# Patient Record
Sex: Female | Born: 1955
Health system: Southern US, Community
[De-identification: ages and names within clinical notes are randomized; demographics above are authoritative.]

## PROBLEM LIST (undated history)

## (undated) DIAGNOSIS — E119 Type 2 diabetes mellitus without complications: Secondary | ICD-10-CM

## (undated) DIAGNOSIS — D649 Anemia, unspecified: Secondary | ICD-10-CM

## (undated) DIAGNOSIS — I10 Essential (primary) hypertension: Secondary | ICD-10-CM

## (undated) DIAGNOSIS — E785 Hyperlipidemia, unspecified: Secondary | ICD-10-CM

## (undated) HISTORY — DX: Type 2 diabetes mellitus without complications: E11.9

## (undated) HISTORY — DX: Hyperlipidemia, unspecified: E78.5

## (undated) HISTORY — DX: Essential (primary) hypertension: I10

## (undated) HISTORY — DX: Anemia, unspecified: D64.9

## (undated) HISTORY — PX: PARTIAL HYSTERECTOMY: SHX80

---

## 1998-07-26 ENCOUNTER — Inpatient Hospital Stay (HOSPITAL_COMMUNITY): Admission: RE | Admit: 1998-07-26 | Discharge: 1998-07-28 | Payer: Self-pay | Admitting: Gynecology

## 2002-02-25 ENCOUNTER — Other Ambulatory Visit: Admission: RE | Admit: 2002-02-25 | Discharge: 2002-02-25 | Payer: Self-pay | Admitting: Gynecology

## 2004-11-21 ENCOUNTER — Other Ambulatory Visit: Admission: RE | Admit: 2004-11-21 | Discharge: 2004-11-21 | Payer: Self-pay | Admitting: Gynecology

## 2014-01-28 DIAGNOSIS — I10 Essential (primary) hypertension: Secondary | ICD-10-CM | POA: Insufficient documentation

## 2014-01-28 DIAGNOSIS — E1159 Type 2 diabetes mellitus with other circulatory complications: Secondary | ICD-10-CM | POA: Insufficient documentation

## 2015-06-03 ENCOUNTER — Ambulatory Visit: Payer: Self-pay | Admitting: Family Medicine

## 2016-03-24 DIAGNOSIS — Z6831 Body mass index (BMI) 31.0-31.9, adult: Secondary | ICD-10-CM | POA: Diagnosis not present

## 2016-03-24 DIAGNOSIS — Z01419 Encounter for gynecological examination (general) (routine) without abnormal findings: Secondary | ICD-10-CM | POA: Diagnosis not present

## 2016-03-24 DIAGNOSIS — Z1231 Encounter for screening mammogram for malignant neoplasm of breast: Secondary | ICD-10-CM | POA: Diagnosis not present

## 2016-04-10 DIAGNOSIS — Z1389 Encounter for screening for other disorder: Secondary | ICD-10-CM | POA: Diagnosis not present

## 2016-04-10 DIAGNOSIS — Z131 Encounter for screening for diabetes mellitus: Secondary | ICD-10-CM | POA: Diagnosis not present

## 2016-04-10 DIAGNOSIS — Z13220 Encounter for screening for lipoid disorders: Secondary | ICD-10-CM | POA: Diagnosis not present

## 2016-04-10 DIAGNOSIS — Z6830 Body mass index (BMI) 30.0-30.9, adult: Secondary | ICD-10-CM | POA: Diagnosis not present

## 2016-04-10 DIAGNOSIS — Z1329 Encounter for screening for other suspected endocrine disorder: Secondary | ICD-10-CM | POA: Diagnosis not present

## 2016-04-10 DIAGNOSIS — Z01419 Encounter for gynecological examination (general) (routine) without abnormal findings: Secondary | ICD-10-CM | POA: Diagnosis not present

## 2016-04-14 ENCOUNTER — Encounter: Payer: Self-pay | Admitting: Obstetrics & Gynecology

## 2016-05-26 DIAGNOSIS — Z6830 Body mass index (BMI) 30.0-30.9, adult: Secondary | ICD-10-CM | POA: Diagnosis not present

## 2016-05-26 DIAGNOSIS — I10 Essential (primary) hypertension: Secondary | ICD-10-CM | POA: Diagnosis not present

## 2016-05-26 DIAGNOSIS — E1165 Type 2 diabetes mellitus with hyperglycemia: Secondary | ICD-10-CM | POA: Diagnosis not present

## 2016-05-26 DIAGNOSIS — R718 Other abnormality of red blood cells: Secondary | ICD-10-CM | POA: Diagnosis not present

## 2017-08-01 ENCOUNTER — Encounter: Payer: Self-pay | Admitting: Gastroenterology

## 2017-09-12 ENCOUNTER — Telehealth: Payer: Self-pay | Admitting: *Deleted

## 2017-09-12 NOTE — Telephone Encounter (Signed)
No show PV today, called pt. Line busy will try again later.

## 2017-09-12 NOTE — Telephone Encounter (Signed)
Pt called back and reschedule PV 11/07/17.

## 2017-09-25 ENCOUNTER — Encounter: Payer: Self-pay | Admitting: Gastroenterology

## 2017-11-21 ENCOUNTER — Encounter: Payer: Self-pay | Admitting: Gastroenterology

## 2018-01-07 ENCOUNTER — Ambulatory Visit (AMBULATORY_SURGERY_CENTER): Payer: Self-pay | Admitting: *Deleted

## 2018-01-07 VITALS — Ht 60.0 in | Wt 162.0 lb

## 2018-01-07 DIAGNOSIS — Z1211 Encounter for screening for malignant neoplasm of colon: Secondary | ICD-10-CM

## 2018-01-07 MED ORDER — PEG-KCL-NACL-NASULF-NA ASC-C 140 G PO SOLR: 1.0000 | Freq: Once | ORAL | 0 refills | Status: AC

## 2018-01-07 NOTE — Progress Notes (Signed)
Patient denies any allergies to eggs or soy. Patient denies any problems with anesthesia/sedation. Patient denies any oxygen use at home. Patient denies taking any diet/weight loss medications or blood thinners. EMMI education assisgned to patient on colonoscopy, this was explained and instructions given to patient. [employer] universal co-pay card given to pt. And explained.

## 2018-01-21 ENCOUNTER — Encounter: Payer: Self-pay | Admitting: Gastroenterology

## 2018-01-22 DIAGNOSIS — Z1231 Encounter for screening mammogram for malignant neoplasm of breast: Secondary | ICD-10-CM | POA: Diagnosis not present

## 2018-01-25 DIAGNOSIS — Z6832 Body mass index (BMI) 32.0-32.9, adult: Secondary | ICD-10-CM | POA: Diagnosis not present

## 2018-01-25 DIAGNOSIS — R638 Other symptoms and signs concerning food and fluid intake: Secondary | ICD-10-CM | POA: Insufficient documentation

## 2018-01-25 DIAGNOSIS — Z01411 Encounter for gynecological examination (general) (routine) with abnormal findings: Secondary | ICD-10-CM | POA: Diagnosis not present

## 2018-01-25 DIAGNOSIS — E669 Obesity, unspecified: Secondary | ICD-10-CM | POA: Insufficient documentation

## 2018-01-29 DIAGNOSIS — D573 Sickle-cell trait: Secondary | ICD-10-CM | POA: Diagnosis not present

## 2018-01-29 DIAGNOSIS — E7800 Pure hypercholesterolemia, unspecified: Secondary | ICD-10-CM | POA: Diagnosis not present

## 2018-01-29 DIAGNOSIS — I10 Essential (primary) hypertension: Secondary | ICD-10-CM | POA: Diagnosis not present

## 2018-01-29 DIAGNOSIS — E119 Type 2 diabetes mellitus without complications: Secondary | ICD-10-CM | POA: Diagnosis not present

## 2018-04-01 ENCOUNTER — Ambulatory Visit (AMBULATORY_SURGERY_CENTER): Payer: Self-pay | Admitting: *Deleted

## 2018-04-01 ENCOUNTER — Encounter: Payer: Self-pay | Admitting: Gastroenterology

## 2018-04-01 VITALS — Ht 60.0 in | Wt 154.0 lb

## 2018-04-01 DIAGNOSIS — Z1211 Encounter for screening for malignant neoplasm of colon: Secondary | ICD-10-CM

## 2018-04-01 MED ORDER — PEG-KCL-NACL-NASULF-NA ASC-C 140 G PO SOLR: 1.0000 | Freq: Once | ORAL | 0 refills | Status: AC

## 2018-04-01 NOTE — Progress Notes (Signed)
Patient denies any allergies to eggs or soy. Patient denies any problems with anesthesia/sedation. Patient denies any oxygen use at home. Patient denies taking any diet/weight loss medications or blood thinners. EMMI education assisgned to patient on colonoscopy, this was explained and instructions given to patient. 

## 2018-04-11 ENCOUNTER — Encounter: Payer: Self-pay | Admitting: *Deleted

## 2018-04-15 ENCOUNTER — Encounter: Payer: Self-pay | Admitting: Gastroenterology

## 2018-04-15 ENCOUNTER — Ambulatory Visit (AMBULATORY_SURGERY_CENTER): Payer: BLUE CROSS/BLUE SHIELD | Admitting: Gastroenterology

## 2018-04-15 VITALS — BP 118/59 | HR 72 | Temp 97.5°F | Resp 18 | Ht 60.0 in | Wt 154.0 lb

## 2018-04-15 DIAGNOSIS — Z1211 Encounter for screening for malignant neoplasm of colon: Secondary | ICD-10-CM | POA: Diagnosis not present

## 2018-04-15 MED ORDER — SODIUM CHLORIDE 0.9 % IV SOLN: 500.0000 mL | Freq: Once | INTRAVENOUS | Status: DC

## 2018-04-15 NOTE — Progress Notes (Signed)
Pt's states no medical or surgical changes since previsit or office visit. 

## 2018-04-15 NOTE — Patient Instructions (Signed)
YOU HAD AN ENDOSCOPIC PROCEDURE TODAY AT THE [facility]:   Refer to the procedure report that was given to you for any specific questions about what was found during the examination.  If the procedure report does not answer your questions, please call your gastroenterologist to clarify.  If you requested that your care partner not be given the details of your procedure findings, then the procedure report has been included in a sealed envelope for you to review at your convenience later.  YOU SHOULD EXPECT: Some feelings of bloating in the abdomen. Passage of more gas than usual.  Walking can help get rid of the air that was put into your GI tract during the procedure and reduce the bloating. If you had a lower endoscopy (such as a colonoscopy or flexible sigmoidoscopy) you may notice spotting of blood in your stool or on the toilet paper. If you underwent a bowel prep for your procedure, you may not have a normal bowel movement for a few days.  Please Note:  You might notice some irritation and congestion in your nose or some drainage.  This is from the oxygen used during your procedure.  There is no need for concern and it should clear up in a day or so.  SYMPTOMS TO REPORT IMMEDIATELY:   Following lower endoscopy (colonoscopy or flexible sigmoidoscopy):  Excessive amounts of blood in the stool  Significant tenderness or worsening of abdominal pains  Swelling of the abdomen that is new, acute  Fever of 100F or higher  For urgent or emergent issues, a gastroenterologist can be reached at any hour by calling [phone].   DIET:  We do recommend a small meal at first, but then you may proceed to your regular diet.  Drink plenty of fluids but you should avoid alcoholic beverages for 24 hours.  ACTIVITY:  You should plan to take it easy for the rest of today and you should NOT DRIVE or use heavy machinery until tomorrow (because of the sedation medicines used during the test).     FOLLOW UP: Our staff will call the number listed on your records the next business day following your procedure to check on you and address any questions or concerns that you may have regarding the information given to you following your procedure. If we do not reach you, we will leave a message.  However, if you are feeling well and you are not experiencing any problems, there is no need to return our call.  We will assume that you have returned to your regular daily activities without incident.  If any biopsies were taken you will be contacted by phone or by letter within the next 1-3 weeks.  Please call us at [phone] if you have not heard about the biopsies in 3 weeks.    SIGNATURES/CONFIDENTIALITY: You and/or your care partner have signed paperwork which will be entered into your electronic medical record.  These signatures attest to the fact that that the information above on your After Visit Summary has been reviewed and is understood.  Full responsibility of the confidentiality of this discharge information lies with you and/or your care-partner. 

## 2018-04-15 NOTE — Op Note (Signed)
[facility] Patient Name: Marcia Garcia Procedure Date: 04/15/2018 10:14 AM MRN: [id number] Endoscopist: Sherilyn Cooter L. Myrtie Neither , MD Age: 62 Referring MD:  Date of Birth: 1956/02/08 Gender: Female Account #: [id number] Procedure:                Colonoscopy Indications:              Screening for colorectal malignant neoplasm, This                            is the patient's first colonoscopy Medicines:                Monitored Anesthesia Care Procedure:                Pre-Anesthesia Assessment:                           - Prior to the procedure, a History and Physical                            was performed, and patient medications and                            allergies were reviewed. The patient's tolerance of                            previous anesthesia was also reviewed. The risks                            and benefits of the procedure and the sedation                            options and risks were discussed with the patient.                            All questions were answered, and informed consent                            was obtained. Anticoagulants: The patient has taken                            aspirin. It was decided not to withhold this                            medication prior to the procedure. ASA Grade                            Assessment: II - A patient with mild systemic                            disease. After reviewing the risks and benefits,                            the patient was deemed in satisfactory condition to  undergo the procedure.                           After obtaining informed consent, the colonoscope                            was passed under direct vision. Throughout the                            procedure, the patient's blood pressure, pulse, and                            oxygen saturations were monitored continuously. The                            Model CF-HQ190L [id number]) scope was introduced                             through the anus and advanced to the the cecum,                            identified by appendiceal orifice and ileocecal                            valve. The colonoscopy was performed without                            difficulty. The patient tolerated the procedure                            well. The quality of the bowel preparation was                            excellent. The ileocecal valve, appendiceal                            orifice, and rectum were photographed. The quality                            of the bowel preparation was evaluated using the                            BBPS [facility] Bowel Preparation Scale) with scores                            of: Right Colon = 3, Transverse Colon = 3 and Left                            Colon = 3 (entire mucosa seen well with no residual                            staining, small fragments of stool or opaque  liquid). The total BBPS score equals 9. Scope In: 10:18:25 AM Scope Out: 10:33:15 AM Scope Withdrawal Time: 0 hours 11 minutes 6 seconds  Total Procedure Duration: 0 hours 14 minutes 50 seconds  Findings:                 The digital rectal exam findings include decreased                            sphincter tone.                           The entire examined colon appeared normal on direct                            and retroflexion views. Complications:            No immediate complications. Estimated Blood Loss:     Estimated blood loss: none. Impression:               - Decreased sphincter tone found on digital rectal                            exam.                           - The entire examined colon is normal on direct and                            retroflexion views.                           - No specimens collected. Recommendation:           - Patient has a contact number available for                            emergencies. The signs and symptoms of potential                             delayed complications were discussed with the                            patient. Return to normal activities tomorrow.                            Written discharge instructions were provided to the                            patient.                           - Resume previous diet.                           - Continue present medications.                           - Repeat colonoscopy in 10 years for screening  purposes. Sonja Manseau L. Myrtie Neither, MD 04/15/2018 10:35:44 AM This report has been signed electronically.

## 2018-04-15 NOTE — Progress Notes (Signed)
Report given to PACU, vss 

## 2018-04-16 ENCOUNTER — Telehealth: Payer: Self-pay

## 2018-04-16 NOTE — Telephone Encounter (Signed)
  Follow up Call-  Call back number 04/15/2018  Post procedure Call Back phone  # [phone]  Permission to leave phone message Yes  Some recent data might be hidden     Patient questions:  Do you have a fever, pain , or abdominal swelling? No. Pain Score  0 *  Have you tolerated food without any problems? Yes.    Have you been able to return to your normal activities? Yes.    Do you have any questions about your discharge instructions: Diet   No. Medications  No. Follow up visit  No.  Do you have questions or concerns about your Care? No.  Actions: * If pain score is 4 or above: No action needed, pain <4.

## 2018-07-26 DIAGNOSIS — E1165 Type 2 diabetes mellitus with hyperglycemia: Secondary | ICD-10-CM | POA: Diagnosis not present

## 2018-07-26 DIAGNOSIS — R82998 Other abnormal findings in urine: Secondary | ICD-10-CM | POA: Diagnosis not present

## 2018-07-26 DIAGNOSIS — Z0000 Encounter for general adult medical examination without abnormal findings: Secondary | ICD-10-CM | POA: Diagnosis not present

## 2018-07-26 DIAGNOSIS — E7800 Pure hypercholesterolemia, unspecified: Secondary | ICD-10-CM | POA: Diagnosis not present

## 2018-08-02 DIAGNOSIS — Z0000 Encounter for general adult medical examination without abnormal findings: Secondary | ICD-10-CM | POA: Diagnosis not present

## 2018-08-02 DIAGNOSIS — D573 Sickle-cell trait: Secondary | ICD-10-CM | POA: Diagnosis not present

## 2018-08-02 DIAGNOSIS — R718 Other abnormality of red blood cells: Secondary | ICD-10-CM | POA: Diagnosis not present

## 2018-08-02 DIAGNOSIS — Z1331 Encounter for screening for depression: Secondary | ICD-10-CM | POA: Diagnosis not present

## 2018-08-02 DIAGNOSIS — E119 Type 2 diabetes mellitus without complications: Secondary | ICD-10-CM | POA: Diagnosis not present

## 2018-08-02 DIAGNOSIS — I10 Essential (primary) hypertension: Secondary | ICD-10-CM | POA: Diagnosis not present

## 2018-08-05 DIAGNOSIS — H2512 Age-related nuclear cataract, left eye: Secondary | ICD-10-CM | POA: Diagnosis not present

## 2018-08-05 DIAGNOSIS — H524 Presbyopia: Secondary | ICD-10-CM | POA: Diagnosis not present

## 2018-08-05 DIAGNOSIS — Z961 Presence of intraocular lens: Secondary | ICD-10-CM | POA: Diagnosis not present

## 2019-03-11 DIAGNOSIS — E119 Type 2 diabetes mellitus without complications: Secondary | ICD-10-CM | POA: Diagnosis not present

## 2019-03-11 DIAGNOSIS — D573 Sickle-cell trait: Secondary | ICD-10-CM | POA: Diagnosis not present

## 2019-03-11 DIAGNOSIS — E7800 Pure hypercholesterolemia, unspecified: Secondary | ICD-10-CM | POA: Diagnosis not present

## 2019-07-17 ENCOUNTER — Telehealth: Payer: Self-pay

## 2019-07-17 NOTE — Telephone Encounter (Signed)
Patient calling and states that her late husband, Marcia Garcia, was a patient of yours and she was always impressed with your work. Wanted to know if you'd be willing to accept her as a new patient?

## 2019-07-18 NOTE — Telephone Encounter (Signed)
I can accept her

## 2019-07-18 NOTE — Telephone Encounter (Signed)
Patient scheduled for 07/29/2019 at 10:30am.

## 2019-07-28 DIAGNOSIS — E1169 Type 2 diabetes mellitus with other specified complication: Secondary | ICD-10-CM | POA: Insufficient documentation

## 2019-07-28 DIAGNOSIS — E119 Type 2 diabetes mellitus without complications: Secondary | ICD-10-CM | POA: Insufficient documentation

## 2019-07-28 DIAGNOSIS — E785 Hyperlipidemia, unspecified: Secondary | ICD-10-CM | POA: Insufficient documentation

## 2019-07-28 NOTE — Progress Notes (Signed)
Subjective:    Patient ID: Marcia Garcia, female    DOB: 05-23-1956, 64 y.o.   MRN: [id number]  HPI  She is here to establish with a new pcp.  I saw her late husband who died in 09-30-17.   The patient is here for follow up of their chronic medical problems.  She is not exercising regularly.      Hypertension: She is taking her medication daily. She is compliant with a low sodium diet.  She does not monitor her blood pressure at home.    Diabetes: She is taking her medication daily as prescribed. She is compliant with a diabetic diet.  She monitors her sugars and they have been running 90-100's. She denies numbness/tingling in her feet. She is not up-to-date with an ophthalmology examination.   Hyperlipidemia: She is not taking her medication daily.  She had a restless leg-like feeling with the Crestor and stopped taking it.  She is compliant with a low fat/cholesterol diet.      Medications and allergies reviewed with patient and updated if appropriate.  Patient Active Problem List   Diagnosis Date Noted  . Diabetes ([place]) 07/28/2019  . Hyperlipidemia 07/28/2019  . Increased body mass index 01/25/2018  . Hypertensive disorder 01/28/2014    Current Outpatient Medications on File Prior to Visit  Medication Sig Dispense Refill  . acetaminophen (TYLENOL) 500 MG tablet Take 1,000 mg by mouth daily.    Marland Kitchen aspirin EC 81 MG tablet Take 1 tablet by mouth daily.    Marland Kitchen lisinopril-hydrochlorothiazide (ZESTORETIC) 20-25 MG tablet Take 1 tablet by mouth daily.    . Multiple Vitamin (MULTIVITAMIN) tablet Take 1 tablet by mouth daily.    Marland Kitchen SYNJARDY XR 10-998 MG TB24 Take 1 tablet by mouth daily.    . Rosuvastatin Calcium (CRESTOR PO) Take 1 tablet by mouth daily.     No current facility-administered medications on file prior to visit.    Past Medical History:  Diagnosis Date  . Anemia   . Diabetes mellitus without complication ([place])   . Hyperlipidemia   . Hypertension     Past  Surgical History:  Procedure Laterality Date  . PARTIAL HYSTERECTOMY      Social History   Socioeconomic History  . Marital status: Widowed    Spouse name: Not on file  . Number of children: Not on file  . Years of education: Not on file  . Highest education level: Not on file  Occupational History  . Not on file  Tobacco Use  . Smoking status: Never Smoker  . Smokeless tobacco: Never Used  Substance and Sexual Activity  . Alcohol use: Not Currently  . Drug use: Not Currently  . Sexual activity: Not Currently  Other Topics Concern  . Not on file  Social History Narrative  . Not on file   Social Determinants of Health   Financial Resource Strain:   . Difficulty of Paying Living Expenses: Not on file  Food Insecurity:   . Worried About Charity fundraiser in the Last Year: Not on file  . Ran Out of Food in the Last Year: Not on file  Transportation Needs:   . Lack of Transportation (Medical): Not on file  . Lack of Transportation (Non-Medical): Not on file  Physical Activity:   . Days of Exercise per Week: Not on file  . Minutes of Exercise per Session: Not on file  Stress:   . Feeling of Stress : Not on  file  Social Connections:   . Frequency of Communication with Friends and Family: Not on file  . Frequency of Social Gatherings with Friends and Family: Not on file  . Attends Religious Services: Not on file  . Active Member of Clubs or Organizations: Not on file  . Attends Banker Meetings: Not on file  . Marital Status: Not on file    Family History  Problem Relation Age of Onset  . Diabetes Mother   . Diabetes Maternal Grandmother   . Colon cancer Neg Hx   . Esophageal cancer Neg Hx   . Rectal cancer Neg Hx   . Stomach cancer Neg Hx     Review of Systems  Constitutional: Negative for chills, fatigue and fever.  Eyes: Positive for visual disturbance (occ blurry - sees eye doctor - not related to DM).  Respiratory: Negative for cough,  shortness of breath and wheezing.   Cardiovascular: Negative for chest pain, palpitations and leg swelling.  Gastrointestinal: Negative for abdominal pain and nausea.       No gerd  Genitourinary: Negative for dysuria and hematuria.  Musculoskeletal: Negative for arthralgias and back pain.  Neurological: Negative for light-headedness, numbness and headaches.  Psychiatric/Behavioral: Negative for dysphoric mood. The patient is not nervous/anxious.        Objective:   Vitals:   07/29/19 1034  BP: 130/84  Pulse: 71  Resp: 16  Temp: 98.4 F (36.9 C)  SpO2: 98%   BP Readings from Last 3 Encounters:  07/29/19 130/84  04/15/18 (!) 118/59   Wt Readings from Last 3 Encounters:  07/29/19 157 lb (71.2 kg)  04/15/18 154 lb (69.9 kg)  04/01/18 154 lb (69.9 kg)   Body mass index is 30.66 kg/m.   Physical Exam    Constitutional: Appears well-developed and well-nourished. No distress.  HENT:  Head: Normocephalic and atraumatic.  Neck: Neck supple. No tracheal deviation present. No thyromegaly present.  No cervical lymphadenopathy Cardiovascular: Normal rate, regular rhythm and normal heart sounds.   No murmur heard. No carotid bruit .  No edema Pulmonary/Chest: Effort normal and breath sounds normal. No respiratory distress. No has no wheezes. No rales.  Abdomen: soft, NT, ND Skin: Skin is warm and dry. Not diaphoretic.  Psychiatric: Normal mood and affect. Behavior is normal.      Assessment & Plan:    See Problem List for Assessment and Plan of chronic medical problems.    This visit occurred during the SARS-CoV-2 public health emergency.  Safety protocols were in place, including screening questions prior to the visit, additional usage of staff PPE, and extensive cleaning of exam room while observing appropriate contact time as indicated for disinfecting solutions.

## 2019-07-29 ENCOUNTER — Encounter: Payer: Self-pay | Admitting: Internal Medicine

## 2019-07-29 ENCOUNTER — Other Ambulatory Visit: Payer: Self-pay

## 2019-07-29 ENCOUNTER — Ambulatory Visit (INDEPENDENT_AMBULATORY_CARE_PROVIDER_SITE_OTHER): Payer: 59 | Admitting: Internal Medicine

## 2019-07-29 VITALS — BP 130/84 | HR 71 | Temp 98.4°F | Resp 16 | Ht 60.0 in | Wt 157.0 lb

## 2019-07-29 DIAGNOSIS — E782 Mixed hyperlipidemia: Secondary | ICD-10-CM

## 2019-07-29 DIAGNOSIS — E6609 Other obesity due to excess calories: Secondary | ICD-10-CM | POA: Diagnosis not present

## 2019-07-29 DIAGNOSIS — I10 Essential (primary) hypertension: Secondary | ICD-10-CM

## 2019-07-29 DIAGNOSIS — Z6830 Body mass index (BMI) 30.0-30.9, adult: Secondary | ICD-10-CM

## 2019-07-29 DIAGNOSIS — E119 Type 2 diabetes mellitus without complications: Secondary | ICD-10-CM | POA: Diagnosis not present

## 2019-07-29 LAB — COMPREHENSIVE METABOLIC PANEL
ALT: 16 U/L (ref 0–35)
AST: 17 U/L (ref 0–37)
Albumin: 4.5 g/dL (ref 3.5–5.2)
Alkaline Phosphatase: 57 U/L (ref 39–117)
BUN: 15 mg/dL (ref 6–23)
CO2: 31 mEq/L (ref 19–32)
Calcium: 10.9 mg/dL — ABNORMAL HIGH (ref 8.4–10.5)
Chloride: 100 mEq/L (ref 96–112)
Creatinine, Ser: 0.76 mg/dL (ref 0.40–1.20)
GFR: 92.95 mL/min (ref >60.00)
Glucose, Bld: 111 mg/dL — ABNORMAL HIGH (ref 70–99)
Potassium: 3.8 mEq/L (ref 3.5–5.1)
Sodium: 137 mEq/L (ref 135–145)
Total Bilirubin: 0.6 mg/dL (ref 0.2–1.2)
Total Protein: 8.4 g/dL — ABNORMAL HIGH (ref 6.0–8.3)

## 2019-07-29 LAB — LIPID PANEL
Cholesterol: 220 mg/dL — ABNORMAL HIGH (ref 0–200)
HDL: 50.2 mg/dL (ref >39.00)
LDL Cholesterol: 144 mg/dL — ABNORMAL HIGH (ref 0–99)
NonHDL: 170.13
Total CHOL/HDL Ratio: 4
Triglycerides: 130 mg/dL (ref 0.0–149.0)
VLDL: 26 mg/dL (ref 0.0–40.0)

## 2019-07-29 LAB — CBC WITH DIFFERENTIAL/PLATELET
Basophils Absolute: 0 10*3/uL (ref 0.0–0.1)
Basophils Relative: 0.4 % (ref 0.0–3.0)
Eosinophils Absolute: 0.1 10*3/uL (ref 0.0–0.7)
Eosinophils Relative: 2.2 % (ref 0.0–5.0)
HCT: 37.2 % (ref 36.0–46.0)
Hemoglobin: 11.5 g/dL — ABNORMAL LOW (ref 12.0–15.0)
Lymphocytes Relative: 39.3 % (ref 12.0–46.0)
Lymphs Abs: 2.5 10*3/uL (ref 0.7–4.0)
MCHC: 30.9 g/dL (ref 30.0–36.0)
MCV: 68.8 fl — ABNORMAL LOW (ref 78.0–100.0)
Monocytes Absolute: 0.3 10*3/uL (ref 0.1–1.0)
Monocytes Relative: 5 % (ref 3.0–12.0)
Neutro Abs: 3.4 10*3/uL (ref 1.4–7.7)
Neutrophils Relative %: 53.1 % (ref 43.0–77.0)
Platelets: 418 10*3/uL — ABNORMAL HIGH (ref 150.0–400.0)
RBC: 5.41 Mil/uL — ABNORMAL HIGH (ref 3.87–5.11)
RDW: 17.6 % — ABNORMAL HIGH (ref 11.5–15.5)
WBC: 6.5 10*3/uL (ref 4.0–10.5)

## 2019-07-29 LAB — HEMOGLOBIN A1C: Hgb A1c MFr Bld: 7.5 % — ABNORMAL HIGH (ref 4.6–6.5)

## 2019-07-29 LAB — TSH: TSH: 3.28 u[IU]/mL (ref 0.35–4.50)

## 2019-07-29 NOTE — Patient Instructions (Addendum)
°  Blood work was ordered.   ° ° °Medications reviewed and updated.  Changes include :   none ° ° ° °Please followup in 6 months ° ° °

## 2019-07-29 NOTE — Assessment & Plan Note (Signed)
Not currently exercising regularly because she is caring for her mother Discussed the importance of regular exercise She is eating healthy overall, but her eating habits has changed since she started taking care of her mother Has done intermittent fasting in the past and working on diet

## 2019-07-29 NOTE — Assessment & Plan Note (Signed)
Chronic Did not tolerate atorvastatin Review statin 10 mg daily because restless leg symptoms and she stopped taking this Check lipids, TSH, CMP Can consider starting Crestor 5 mg 3 times a week or a different statin-we will see what her blood work looks like Regular exercise encouraged

## 2019-07-29 NOTE — Assessment & Plan Note (Signed)
Chronic Sugars well controlled at home Check A1c She is compliant with a diabetic diet Encouraged regular exercise Continue current medication Follow-up in 6 months She will schedule an eye appointment

## 2019-07-29 NOTE — Assessment & Plan Note (Signed)
Chronic BP well controlled Current regimen effective and well tolerated Continue current medications at current doses cmp, CBC TSH

## 2019-08-11 ENCOUNTER — Telehealth: Payer: Self-pay | Admitting: Internal Medicine

## 2019-08-11 NOTE — Telephone Encounter (Signed)
Patient was seen on 2/16 and Dr.Burns stated she would refill this medication if the patient needed it.  Patient is requesting it now.  Rosuvastatin Calcium (CRESTOR PO) Pharmacy on file.

## 2019-08-12 NOTE — Telephone Encounter (Signed)
Tried calling pt back in regards. Need clarification on strength that is being taken. No VM was set up.

## 2019-08-13 ENCOUNTER — Other Ambulatory Visit: Payer: Self-pay

## 2019-08-13 MED ORDER — ROSUVASTATIN CALCIUM 10 MG PO TABS: 10.0000 mg | ORAL_TABLET | Freq: Every day | ORAL | 1 refills | Status: DC

## 2019-08-13 NOTE — Telephone Encounter (Signed)
LVM to call back in regards. 

## 2019-10-21 LAB — HM MAMMOGRAPHY

## 2019-10-27 DIAGNOSIS — D649 Anemia, unspecified: Secondary | ICD-10-CM | POA: Insufficient documentation

## 2019-10-27 NOTE — Patient Instructions (Addendum)
°  Blood work was ordered.   ° ° °Medications reviewed and updated.  Changes include :   none ° ° ° °Please followup in 6 months ° ° °

## 2019-10-27 NOTE — Progress Notes (Signed)
Subjective:    Patient ID: Marcia Garcia, female    DOB: 1956-02-07, 64 y.o.   MRN: [id number]  HPI The patient is here for follow up of their chronic medical problems, including hypertension, diabetes, hyperlipidemia.  She is not exercising regularly.   She walks irregularly.      Medications and allergies reviewed with patient and updated if appropriate.  Patient Active Problem List   Diagnosis Date Noted  . Anemia 10/27/2019  . Hypercalcemia 10/27/2019  . Diabetes ([place]) 07/28/2019  . Hyperlipidemia 07/28/2019  . Obese 01/25/2018  . Hypertensive disorder 01/28/2014    Current Outpatient Medications on File Prior to Visit  Medication Sig Dispense Refill  . acetaminophen (TYLENOL) 500 MG tablet Take 1,000 mg by mouth daily.    Marland Kitchen aspirin EC 81 MG tablet Take 1 tablet by mouth daily.    Marland Kitchen lisinopril-hydrochlorothiazide (ZESTORETIC) 20-25 MG tablet Take 1 tablet by mouth daily.    . Multiple Vitamin (MULTIVITAMIN) tablet Take 1 tablet by mouth daily.    . rosuvastatin (CRESTOR) 10 MG tablet Take 1 tablet (10 mg total) by mouth daily. 90 tablet 1  . SYNJARDY XR 10-998 MG TB24 Take 1 tablet by mouth daily.     No current facility-administered medications on file prior to visit.    Past Medical History:  Diagnosis Date  . Anemia   . Diabetes mellitus without complication ([place])   . Hyperlipidemia   . Hypertension     Past Surgical History:  Procedure Laterality Date  . PARTIAL HYSTERECTOMY      Social History   Socioeconomic History  . Marital status: Widowed    Spouse name: Not on file  . Number of children: Not on file  . Years of education: Not on file  . Highest education level: Not on file  Occupational History  . Not on file  Tobacco Use  . Smoking status: Never Smoker  . Smokeless tobacco: Never Used  Substance and Sexual Activity  . Alcohol use: Not Currently  . Drug use: Not Currently  . Sexual activity: Not Currently  Other Topics Concern    . Not on file  Social History Narrative  . Not on file   Social Determinants of Health   Financial Resource Strain:   . Difficulty of Paying Living Expenses:   Food Insecurity:   . Worried About Charity fundraiser in the Last Year:   . Arboriculturist in the Last Year:   Transportation Needs:   . Film/video editor (Medical):   Marland Kitchen Lack of Transportation (Non-Medical):   Physical Activity:   . Days of Exercise per Week:   . Minutes of Exercise per Session:   Stress:   . Feeling of Stress :   Social Connections:   . Frequency of Communication with Friends and Family:   . Frequency of Social Gatherings with Friends and Family:   . Attends Religious Services:   . Active Member of Clubs or Organizations:   . Attends Archivist Meetings:   Marland Kitchen Marital Status:     Family History  Problem Relation Age of Onset  . Diabetes Mother   . Diabetes Maternal Grandmother   . Alzheimer's disease Maternal Grandmother   . Colon cancer Neg Hx   . Esophageal cancer Neg Hx   . Rectal cancer Neg Hx   . Stomach cancer Neg Hx     Review of Systems  Constitutional: Positive for fatigue. Negative for fever.  Respiratory: Negative for cough, shortness of breath and wheezing.   Cardiovascular: Negative for chest pain, palpitations and leg swelling.  Neurological: Negative for dizziness, light-headedness and headaches.       Objective:   Vitals:   10/28/19 1003  BP: 126/78  Pulse: 70  Resp: 16  Temp: 98.2 F (36.8 C)  SpO2: 99%   BP Readings from Last 3 Encounters:  10/28/19 126/78  07/29/19 130/84  04/15/18 (!) 118/59   Wt Readings from Last 3 Encounters:  10/28/19 151 lb 12.8 oz (68.9 kg)  07/29/19 157 lb (71.2 kg)  04/15/18 154 lb (69.9 kg)   Body mass index is 29.65 kg/m.   Physical Exam    Constitutional: Appears well-developed and well-nourished. No distress.  HENT:  Head: Normocephalic and atraumatic.  Neck: Neck supple. No tracheal deviation present.  No thyromegaly present.  No cervical lymphadenopathy Cardiovascular: Normal rate, regular rhythm and normal heart sounds.   No murmur heard. No carotid bruit .  No edema Pulmonary/Chest: Effort normal and breath sounds normal. No respiratory distress. No has no wheezes. No rales.  Skin: Skin is warm and dry. Not diaphoretic.  Psychiatric: Normal mood and affect. Behavior is normal.   Diabetic Foot Exam - Simple   Simple Foot Form Diabetic Foot exam was performed with the following findings: Yes 10/28/2019 10:36 AM  Visual Inspection No deformities, no ulcerations, no other skin breakdown bilaterally: Yes Sensation Testing Intact to touch and monofilament testing bilaterally: Yes Pulse Check Posterior Tibialis and Dorsalis pulse intact bilaterally: Yes Comments       Assessment & Plan:    See Problem List for Assessment and Plan of chronic medical problems.    This visit occurred during the SARS-CoV-2 public health emergency.  Safety protocols were in place, including screening questions prior to the visit, additional usage of staff PPE, and extensive cleaning of exam room while observing appropriate contact time as indicated for disinfecting solutions.

## 2019-10-28 ENCOUNTER — Encounter: Payer: Self-pay | Admitting: Internal Medicine

## 2019-10-28 ENCOUNTER — Ambulatory Visit (INDEPENDENT_AMBULATORY_CARE_PROVIDER_SITE_OTHER): Payer: 59 | Admitting: Internal Medicine

## 2019-10-28 ENCOUNTER — Other Ambulatory Visit: Payer: Self-pay

## 2019-10-28 VITALS — BP 126/78 | HR 70 | Temp 98.2°F | Resp 16 | Ht 60.0 in | Wt 151.8 lb

## 2019-10-28 DIAGNOSIS — E119 Type 2 diabetes mellitus without complications: Secondary | ICD-10-CM | POA: Diagnosis not present

## 2019-10-28 DIAGNOSIS — E6609 Other obesity due to excess calories: Secondary | ICD-10-CM

## 2019-10-28 DIAGNOSIS — D649 Anemia, unspecified: Secondary | ICD-10-CM | POA: Diagnosis not present

## 2019-10-28 DIAGNOSIS — I10 Essential (primary) hypertension: Secondary | ICD-10-CM | POA: Diagnosis not present

## 2019-10-28 DIAGNOSIS — Z6830 Body mass index (BMI) 30.0-30.9, adult: Secondary | ICD-10-CM

## 2019-10-28 DIAGNOSIS — E782 Mixed hyperlipidemia: Secondary | ICD-10-CM

## 2019-10-28 LAB — CBC WITH DIFFERENTIAL/PLATELET
Basophils Absolute: 0 10*3/uL (ref 0.0–0.1)
Basophils Relative: 0.6 % (ref 0.0–3.0)
Eosinophils Absolute: 0.1 10*3/uL (ref 0.0–0.7)
Eosinophils Relative: 1.6 % (ref 0.0–5.0)
HCT: 34.2 % — ABNORMAL LOW (ref 36.0–46.0)
Hemoglobin: 10.7 g/dL — ABNORMAL LOW (ref 12.0–15.0)
Lymphocytes Relative: 37.7 % (ref 12.0–46.0)
Lymphs Abs: 2.3 10*3/uL (ref 0.7–4.0)
MCHC: 31.3 g/dL (ref 30.0–36.0)
MCV: 69.6 fl — ABNORMAL LOW (ref 78.0–100.0)
Monocytes Absolute: 0.3 10*3/uL (ref 0.1–1.0)
Monocytes Relative: 5.1 % (ref 3.0–12.0)
Neutro Abs: 3.3 10*3/uL (ref 1.4–7.7)
Neutrophils Relative %: 55 % (ref 43.0–77.0)
Platelets: 404 10*3/uL — ABNORMAL HIGH (ref 150.0–400.0)
RBC: 4.91 Mil/uL (ref 3.87–5.11)
RDW: 18.6 % — ABNORMAL HIGH (ref 11.5–15.5)
WBC: 6 10*3/uL (ref 4.0–10.5)

## 2019-10-28 LAB — COMPREHENSIVE METABOLIC PANEL
ALT: 14 U/L (ref 0–35)
AST: 17 U/L (ref 0–37)
Albumin: 4.7 g/dL (ref 3.5–5.2)
Alkaline Phosphatase: 54 U/L (ref 39–117)
BUN: 18 mg/dL (ref 6–23)
CO2: 30 mEq/L (ref 19–32)
Calcium: 11 mg/dL — ABNORMAL HIGH (ref 8.4–10.5)
Chloride: 101 mEq/L (ref 96–112)
Creatinine, Ser: 0.78 mg/dL (ref 0.40–1.20)
GFR: 90.14 mL/min (ref >60.00)
Glucose, Bld: 108 mg/dL — ABNORMAL HIGH (ref 70–99)
Potassium: 3.9 mEq/L (ref 3.5–5.1)
Sodium: 137 mEq/L (ref 135–145)
Total Bilirubin: 0.7 mg/dL (ref 0.2–1.2)
Total Protein: 8.5 g/dL — ABNORMAL HIGH (ref 6.0–8.3)

## 2019-10-28 LAB — LIPID PANEL
Cholesterol: 155 mg/dL (ref 0–200)
HDL: 45.1 mg/dL (ref >39.00)
LDL Cholesterol: 83 mg/dL (ref 0–99)
NonHDL: 109.67
Total CHOL/HDL Ratio: 3
Triglycerides: 131 mg/dL (ref 0.0–149.0)
VLDL: 26.2 mg/dL (ref 0.0–40.0)

## 2019-10-28 LAB — HEMOGLOBIN A1C: Hgb A1c MFr Bld: 6.1 % (ref 4.6–6.5)

## 2019-10-28 MED ORDER — ROSUVASTATIN CALCIUM 10 MG PO TABS: 10.0000 mg | ORAL_TABLET | Freq: Every day | ORAL | 1 refills | Status: DC

## 2019-10-28 MED ORDER — LISINOPRIL-HYDROCHLOROTHIAZIDE 20-25 MG PO TABS: 1.0000 | ORAL_TABLET | Freq: Every day | ORAL | 1 refills | Status: DC

## 2019-10-28 MED ORDER — SYNJARDY XR 5-1000 MG PO TB24: 1.0000 | ORAL_TABLET | Freq: Every day | ORAL | 1 refills | Status: DC

## 2019-10-28 NOTE — Assessment & Plan Note (Signed)
Chronic Eating less and better Will start exercising Working on weight loss

## 2019-10-28 NOTE — Assessment & Plan Note (Addendum)
Chronic  - calcium has been high in the past Check PTH, calcium, vitamin  D

## 2019-10-28 NOTE — Assessment & Plan Note (Signed)
Chronic BP well controlled Current regimen effective and well tolerated Continue current medications at current doses cmp  

## 2019-10-28 NOTE — Assessment & Plan Note (Signed)
Chronic in nature Has had mild anemia for years Take MVI Check cbc, iron panel

## 2019-10-28 NOTE — Assessment & Plan Note (Signed)
Chronic Check lipid panel  Continue daily statin Regular exercise and healthy diet encouraged  

## 2019-10-28 NOTE — Assessment & Plan Note (Addendum)
Lab Results  Component Value Date   HGBA1C 7.5 (H) 07/29/2019   Chronic Recheck a1c today On synjardy daily Will start exercising Has lost some weight

## 2019-10-29 ENCOUNTER — Telehealth: Payer: Self-pay | Admitting: Internal Medicine

## 2019-10-29 LAB — PTH, INTACT AND CALCIUM
Calcium: 11.2 mg/dL — ABNORMAL HIGH (ref 8.6–10.4)
PTH: 55 pg/mL (ref 14–64)

## 2019-10-29 LAB — VITAMIN D 25 HYDROXY (VIT D DEFICIENCY, FRACTURES): VITD: 39.94 ng/mL (ref 30.00–100.00)

## 2019-10-29 LAB — IRON,TIBC AND FERRITIN PANEL
%SAT: 22 % (calc) (ref 16–45)
Ferritin: 70 ng/mL (ref 16–288)
Iron: 74 ug/dL (ref 45–160)
TIBC: 336 mcg/dL (calc) (ref 250–450)

## 2019-10-29 NOTE — Telephone Encounter (Signed)
All of her blood work is not back yet.  I would like to wait until the pending results are back before I can give any good explanation.

## 2019-10-29 NOTE — Telephone Encounter (Signed)
Pt aware of response and expressed understanding.  

## 2019-10-29 NOTE — Telephone Encounter (Signed)
Please advise 

## 2019-10-29 NOTE — Telephone Encounter (Signed)
Patient calling to discuss lab results.

## 2019-10-30 ENCOUNTER — Other Ambulatory Visit: Payer: Self-pay | Admitting: Internal Medicine

## 2019-10-30 DIAGNOSIS — D649 Anemia, unspecified: Secondary | ICD-10-CM

## 2019-10-31 ENCOUNTER — Telehealth: Payer: Self-pay | Admitting: Internal Medicine

## 2019-10-31 NOTE — Telephone Encounter (Signed)
New message:   Pt is calling and inquiring to see if her lab orders can be sent to the [facility] in [place]. Please advise.

## 2019-11-03 NOTE — Telephone Encounter (Signed)
Will fax orders wednedsay morning.

## 2019-11-04 ENCOUNTER — Other Ambulatory Visit: Payer: 59

## 2019-11-05 NOTE — Addendum Note (Signed)
Addended by: Mercer Pod E on: 11/05/2019 01:18 PM   Modules accepted: Orders

## 2019-11-05 NOTE — Telephone Encounter (Signed)
Orders faxed

## 2019-11-06 ENCOUNTER — Encounter: Payer: Self-pay | Admitting: Internal Medicine

## 2019-11-07 LAB — CBC WITH DIFFERENTIAL/PLATELET
Basophils Absolute: 0 10*3/uL (ref 0.0–0.2)
Basos: 0 %
EOS (ABSOLUTE): 0.1 10*3/uL (ref 0.0–0.4)
Eos: 2 %
Hematocrit: 37.9 % (ref 34.0–46.6)
Hemoglobin: 10.9 g/dL — ABNORMAL LOW (ref 11.1–15.9)
Immature Grans (Abs): 0 10*3/uL (ref 0.0–0.1)
Immature Granulocytes: 0 %
Lymphocytes Absolute: 2.6 10*3/uL (ref 0.7–3.1)
Lymphs: 35 %
MCH: 21.7 pg — ABNORMAL LOW (ref 26.6–33.0)
MCHC: 28.8 g/dL — ABNORMAL LOW (ref 31.5–35.7)
MCV: 75 fL — ABNORMAL LOW (ref 79–97)
Monocytes Absolute: 0.4 10*3/uL (ref 0.1–0.9)
Monocytes: 6 %
Neutrophils Absolute: 4.1 10*3/uL (ref 1.4–7.0)
Neutrophils: 57 %
Platelets: 569 10*3/uL — ABNORMAL HIGH (ref 150–450)
RBC: 5.03 x10E6/uL (ref 3.77–5.28)
RDW: 19 % — ABNORMAL HIGH (ref 11.7–15.4)
WBC: 7.3 10*3/uL (ref 3.4–10.8)

## 2019-11-07 LAB — PROTEIN ELECTROPHORESIS, SERUM
A/G Ratio: 1.1 (ref 0.7–1.7)
Albumin ELP: 4.2 g/dL (ref 2.9–4.4)
Alpha 1: 0.2 g/dL (ref 0.0–0.4)
Alpha 2: 0.8 g/dL (ref 0.4–1.0)
Beta: 1.3 g/dL (ref 0.7–1.3)
Gamma Globulin: 1.7 g/dL (ref 0.4–1.8)
Globulin, Total: 4 g/dL — ABNORMAL HIGH (ref 2.2–3.9)
Total Protein: 8.2 g/dL (ref 6.0–8.5)

## 2019-11-07 LAB — PROTEIN ELECTROPHORESIS, URINE REFLEX
Albumin ELP, Urine: 28.6 %
Alpha-1-Globulin, U: 7.4 %
Alpha-2-Globulin, U: 20.4 %
Beta Globulin, U: 27.6 %
Gamma Globulin, U: 16 %
Protein, Ur: 7.9 mg/dL

## 2019-11-12 ENCOUNTER — Other Ambulatory Visit: Payer: Self-pay | Admitting: Internal Medicine

## 2019-11-12 DIAGNOSIS — D649 Anemia, unspecified: Secondary | ICD-10-CM

## 2020-01-06 ENCOUNTER — Encounter: Payer: Self-pay | Admitting: Family

## 2020-01-06 ENCOUNTER — Other Ambulatory Visit: Payer: Self-pay

## 2020-01-06 ENCOUNTER — Ambulatory Visit (INDEPENDENT_AMBULATORY_CARE_PROVIDER_SITE_OTHER): Payer: 59 | Admitting: Family

## 2020-01-06 VITALS — BP 130/90 | HR 82 | Temp 98.2°F | Wt 152.6 lb

## 2020-01-06 DIAGNOSIS — R109 Unspecified abdominal pain: Secondary | ICD-10-CM

## 2020-01-06 MED ORDER — PANTOPRAZOLE SODIUM 40 MG PO TBEC
40.0000 mg | DELAYED_RELEASE_TABLET | Freq: Two times a day (BID) | ORAL | 0 refills | Status: DC
Start: 2020-01-06 — End: 2020-05-08

## 2020-01-06 NOTE — Progress Notes (Signed)
Marcia Garcia is a 64 y.o. female with the following history as recorded in [employer]:  Patient Active Problem List   Diagnosis Date Noted  . Anemia 10/27/2019  . Hypercalcemia 10/27/2019  . Diabetes ([place]) 07/28/2019  . Hyperlipidemia 07/28/2019  . Obese 01/25/2018  . Hypertensive disorder 01/28/2014    Current Outpatient Medications  Medication Sig Dispense Refill  . acetaminophen (TYLENOL) 500 MG tablet Take 1,000 mg by mouth daily.    Marland Kitchen aspirin EC 81 MG tablet Take 1 tablet by mouth daily.    Marland Kitchen docusate sodium (COLACE) 50 MG capsule Take 50 mg by mouth 2 (two) times daily.    Marland Kitchen lisinopril-hydrochlorothiazide (ZESTORETIC) 20-25 MG tablet Take 1 tablet by mouth daily. 90 tablet 1  . Multiple Vitamin (MULTIVITAMIN) tablet Take 1 tablet by mouth daily.    . rosuvastatin (CRESTOR) 10 MG tablet Take 1 tablet (10 mg total) by mouth daily. 90 tablet 1  . SYNJARDY XR 10-998 MG TB24 Take 1 tablet by mouth daily. 90 tablet 1  . pantoprazole (PROTONIX) 40 MG tablet Take 1 tablet (40 mg total) by mouth 2 (two) times daily before a meal. 60 tablet 0   No current facility-administered medications for this visit.    Allergies: Atorvastatin  Past Medical History:  Diagnosis Date  . Anemia   . Diabetes mellitus without complication ([place])   . Hyperlipidemia   . Hypertension     Past Surgical History:  Procedure Laterality Date  . PARTIAL HYSTERECTOMY      Family History  Problem Relation Age of Onset  . Diabetes Mother   . Diabetes Maternal Grandmother   . Alzheimer's disease Maternal Grandmother   . Colon cancer Neg Hx   . Esophageal cancer Neg Hx   . Rectal cancer Neg Hx   . Stomach cancer Neg Hx     Social History   Tobacco Use  . Smoking status: Never Smoker  . Smokeless tobacco: Never Used  Substance Use Topics  . Alcohol use: Not Currently    Subjective:  Abdominal pain x 1 week; has been having increased pain in her upper back- radiating through to her back; "feels  like my stomach is swelling after I eat." No nausea or vomiting/ no diarrhea or constipation; no new medications;  Has had hysterectomy- ovaries still present; is able to get relief with Tylenol;  Notes that the pain was so bad on Sunday evening she considered going to the ER;    Objective:  Vitals:   01/06/20 1100  BP: (!) 130/90  Pulse: 82  Temp: 98.2 F (36.8 C)  TempSrc: Oral  SpO2: 96%  Weight: 152 lb 9.6 oz (69.2 kg)    General: Well developed, well nourished, in no acute distress  Skin : Warm and dry.  Head: Normocephalic and atraumatic  Lungs: Respirations unlabored; clear to auscultation bilaterally without wheeze, rales, rhonchi  CVS exam: normal rate and regular rhythm.  Abdomen: Soft; nontender; nondistended; normoactive bowel sounds; no masses or hepatosplenomegaly  Neurologic: Alert and oriented; speech intact; face symmetrical; moves all extremities well; CNII-XII intact without focal deficit   Assessment:  1. Abdominal pain, unspecified abdominal location     Plan:  ? Gallbladder; update labs and imaging; bland diet discussed; will give trial of Protonix 40 mg bid in the interim; Will plan for abdominal ultrasound but may need to consider CT based on lab results; follow-up to be determined;  This visit occurred during the SARS-CoV-2 public health emergency.  Safety  protocols were in place, including screening questions prior to the visit, additional usage of staff PPE, and extensive cleaning of exam room while observing appropriate contact time as indicated for disinfecting solutions.     No follow-ups on file.  Orders Placed This Encounter  Procedures  . US Abdomen Complete    Standing Status:   Future    Standing Expiration Date:   01/05/2021    Order Specific Question:   Reason for Exam (SYMPTOM  OR DIAGNOSIS REQUIRED)    Answer:   RUQ abdominal pain    Order Specific Question:   Preferred imaging location?    Answer:   [facility]  . CBC with  Differential/Platelet    Standing Status:   Future    Number of Occurrences:   1    Standing Expiration Date:   01/05/2021  . Comp Met (CMET)    Standing Status:   Future    Number of Occurrences:   1    Standing Expiration Date:   01/05/2021  . Amylase    Standing Status:   Future    Number of Occurrences:   1    Standing Expiration Date:   01/05/2021  . Lipase    Standing Status:   Future    Number of Occurrences:   1    Standing Expiration Date:   01/05/2021    Requested Prescriptions   Signed Prescriptions Disp Refills  . pantoprazole (PROTONIX) 40 MG tablet 60 tablet 0    Sig: Take 1 tablet (40 mg total) by mouth 2 (two) times daily before a meal.

## 2020-01-07 LAB — COMPREHENSIVE METABOLIC PANEL
AG Ratio: 1.2 (calc) (ref 1.0–2.5)
ALT: 15 U/L (ref 6–29)
AST: 16 U/L (ref 10–35)
Albumin: 4.6 g/dL (ref 3.6–5.1)
Alkaline phosphatase (APISO): 68 U/L (ref 37–153)
BUN: 12 mg/dL (ref 7–25)
CO2: 29 mmol/L (ref 20–32)
Calcium: 11 mg/dL — ABNORMAL HIGH (ref 8.6–10.4)
Chloride: 102 mmol/L (ref 98–110)
Creat: 0.85 mg/dL (ref 0.50–0.99)
Globulin: 3.7 g/dL (calc) (ref 1.9–3.7)
Glucose, Bld: 112 mg/dL — ABNORMAL HIGH (ref 65–99)
Potassium: 4.1 mmol/L (ref 3.5–5.3)
Sodium: 139 mmol/L (ref 135–146)
Total Bilirubin: 0.6 mg/dL (ref 0.2–1.2)
Total Protein: 8.3 g/dL — ABNORMAL HIGH (ref 6.1–8.1)

## 2020-01-07 LAB — CBC WITH DIFFERENTIAL/PLATELET
Absolute Monocytes: 378 cells/uL (ref 200–950)
Basophils Absolute: 32 cells/uL (ref 0–200)
Basophils Relative: 0.5 %
Eosinophils Absolute: 151 cells/uL (ref 15–500)
Eosinophils Relative: 2.4 %
HCT: 38 % (ref 35.0–45.0)
Hemoglobin: 10.9 g/dL — ABNORMAL LOW (ref 11.7–15.5)
Lymphs Abs: 2489 cells/uL (ref 850–3900)
MCH: 21.2 pg — ABNORMAL LOW (ref 27.0–33.0)
MCHC: 28.7 g/dL — ABNORMAL LOW (ref 32.0–36.0)
MCV: 73.8 fL — ABNORMAL LOW (ref 80.0–100.0)
Monocytes Relative: 6 %
Neutro Abs: 3251 cells/uL (ref 1500–7800)
Neutrophils Relative %: 51.6 %
Platelets: 523 10*3/uL — ABNORMAL HIGH (ref 140–400)
RBC: 5.15 10*6/uL — ABNORMAL HIGH (ref 3.80–5.10)
RDW: 16.7 % — ABNORMAL HIGH (ref 11.0–15.0)
Total Lymphocyte: 39.5 %
WBC: 6.3 10*3/uL (ref 3.8–10.8)

## 2020-01-07 LAB — LIPASE: Lipase: 230 U/L — ABNORMAL HIGH (ref 7–60)

## 2020-01-07 LAB — AMYLASE: Amylase: 87 U/L (ref 21–101)

## 2020-01-09 ENCOUNTER — Other Ambulatory Visit: Payer: Self-pay | Admitting: Family

## 2020-01-09 ENCOUNTER — Ambulatory Visit (HOSPITAL_COMMUNITY)
Admission: RE | Admit: 2020-01-09 | Discharge: 2020-01-09 | Disposition: A | Discharge status: Home / Self Care | Payer: 59 | Source: Ambulatory Visit | Attending: Family | Admitting: Family

## 2020-01-09 ENCOUNTER — Other Ambulatory Visit: Payer: Self-pay

## 2020-01-09 DIAGNOSIS — R109 Unspecified abdominal pain: Secondary | ICD-10-CM | POA: Diagnosis not present

## 2020-01-09 DIAGNOSIS — R899 Unspecified abnormal finding in specimens from other organs, systems and tissues: Secondary | ICD-10-CM

## 2020-01-13 ENCOUNTER — Encounter: Payer: Self-pay | Admitting: Internal Medicine

## 2020-01-13 NOTE — Progress Notes (Signed)
Subjective:    Patient ID: Marcia Garcia, female    DOB: 11-20-1955, 64 y.o.   MRN: [id number]  HPI The patient is here for follow up of her abdominal pain.  She saw Vernona Rieger 7/27 for her abdominal pain - had it for one week.  She had epigastric pain that was tender to touch.  It started before lunch.  When she ate she felt her stomach was swelling and getting tight.  Her back began to hurt - the middle back.  Pressure on her back was painful.  The pain lasted all day.  She drank lemon water and it helped.  Every day after that when she ate it would come back.  She tried to eat bland.  She had dinner one night and did ok.  She then drank water and the pain started.  She later had a piece of pizza and it started and was more severe.  She thinks the greasy food made it worse.    Now she can still feel some discomfort in her back.  She tried a couple of Tylenol and that resolved the pain.    She has not had the epigastric pain / back pain since just before seeing Vernona Rieger  She has never had nausea, constipation, diarrhea, reflux.     US Abdomen Complete CLINICAL DATA:  Upper abdominal pain  EXAM: ABDOMEN ULTRASOUND COMPLETE  COMPARISON:  None.  FINDINGS: Gallbladder: No gallstones or wall thickening visualized. There is no pericholecystic fluid. No sonographic Murphy sign noted by sonographer.  Common bile duct: Diameter: 3 mm. No intrahepatic, common hepatic, or common bile duct dilatation.  Liver: No focal lesion identified. Liver echogenicity overall is increased. Portal vein is patent on color Doppler imaging with normal direction of blood flow towards the liver.  IVC: No abnormality visualized.  Pancreas: No pancreatic mass or inflammatory focus.  Spleen: Size and appearance within normal limits.  Right Kidney: Length: 12.4 cm. Echogenicity within normal limits. No mass or hydronephrosis visualized.  Left Kidney: Length: 11.1 cm. Echogenicity within normal limits.  No hydronephrosis visualized. There is a cyst arising from the lower pole left kidney measuring 4.7 x 4.1 x 4.6 cm.  Abdominal aorta: No aneurysm visualized.  Other findings: No evident ascites.  IMPRESSION: 1. Increased liver echogenicity, a finding indicative of hepatic steatosis. No focal liver lesions evident.  2. Cyst arising from lower pole left kidney measuring 4.7 x 4.1 x 4.6 cm.  3.  Study otherwise unremarkable.  Electronically Signed   By: Bretta Bang III M.D.   On: 01/09/2020 10:27   Medications and allergies reviewed with patient and updated if appropriate.  Patient Active Problem List   Diagnosis Date Noted  . Anemia 10/27/2019  . Hypercalcemia 10/27/2019  . Diabetes ([facility]) 07/28/2019  . Hyperlipidemia 07/28/2019  . Obese 01/25/2018  . Hypertensive disorder 01/28/2014    Current Outpatient Medications on File Prior to Visit  Medication Sig Dispense Refill  . acetaminophen (TYLENOL) 500 MG tablet Take 1,000 mg by mouth daily.    Marland Kitchen aspirin EC 81 MG tablet Take 1 tablet by mouth daily.    Marland Kitchen docusate sodium (COLACE) 50 MG capsule Take 50 mg by mouth 2 (two) times daily.    Marland Kitchen lisinopril-hydrochlorothiazide (ZESTORETIC) 20-25 MG tablet Take 1 tablet by mouth daily. 90 tablet 1  . Multiple Vitamin (MULTIVITAMIN) tablet Take 1 tablet by mouth daily.    . rosuvastatin (CRESTOR) 10 MG tablet Take 1 tablet (10 mg total)  by mouth daily. 90 tablet 1  . SYNJARDY XR 10-998 MG TB24 Take 1 tablet by mouth daily. 90 tablet 1  . pantoprazole (PROTONIX) 40 MG tablet Take 1 tablet (40 mg total) by mouth 2 (two) times daily before a meal. (Patient not taking: Reported on 01/14/2020) 60 tablet 0   No current facility-administered medications on file prior to visit.    Past Medical History:  Diagnosis Date  . Anemia   . Diabetes mellitus without complication ([facility])   . Hyperlipidemia   . Hypertension     Past Surgical History:  Procedure Laterality Date  . PARTIAL  HYSTERECTOMY      Social History   Socioeconomic History  . Marital status: Widowed    Spouse name: Not on file  . Number of children: Not on file  . Years of education: Not on file  . Highest education level: Not on file  Occupational History  . Not on file  Tobacco Use  . Smoking status: Never Smoker  . Smokeless tobacco: Never Used  Vaping Use  . Vaping Use: Never used  Substance and Sexual Activity  . Alcohol use: Never  . Drug use: Not Currently  . Sexual activity: Not Currently  Other Topics Concern  . Not on file  Social History Narrative  . Not on file   Social Determinants of Health   Financial Resource Strain:   . Difficulty of Paying Living Expenses:   Food Insecurity:   . Worried About Programme researcher, broadcasting/film/video in the Last Year:   . Barista in the Last Year:   Transportation Needs:   . Freight forwarder (Medical):   Marland Kitchen Lack of Transportation (Non-Medical):   Physical Activity:   . Days of Exercise per Week:   . Minutes of Exercise per Session:   Stress:   . Feeling of Stress :   Social Connections:   . Frequency of Communication with Friends and Family:   . Frequency of Social Gatherings with Friends and Family:   . Attends Religious Services:   . Active Member of Clubs or Organizations:   . Attends Banker Meetings:   Marland Kitchen Marital Status:     Family History  Problem Relation Age of Onset  . Diabetes Mother   . Diabetes Maternal Grandmother   . Alzheimer's disease Maternal Grandmother   . Colon cancer Neg Hx   . Esophageal cancer Neg Hx   . Rectal cancer Neg Hx   . Stomach cancer Neg Hx     Review of Systems  Constitutional: Negative for appetite change (normal), chills and fever.  Gastrointestinal: Positive for abdominal distention (recently - swelling in upper abdomen after dinner/end of day). Negative for abdominal pain (none for last 3-4 days), blood in stool, constipation, diarrhea, nausea and vomiting.       No gerd    Genitourinary: Negative for dysuria, frequency and hematuria.  Neurological: Negative for light-headedness and headaches.       Objective:   Vitals:   01/14/20 0816  BP: 130/72  Pulse: 67  Temp: 98.1 F (36.7 C)  SpO2: 99%   BP Readings from Last 3 Encounters:  01/14/20 130/72  01/06/20 (!) 130/90  10/28/19 126/78   Wt Readings from Last 3 Encounters:  01/14/20 152 lb (68.9 kg)  01/06/20 152 lb 9.6 oz (69.2 kg)  10/28/19 151 lb 12.8 oz (68.9 kg)   Body mass index is 29.69 kg/m.   Physical Exam  Constitutional: Appears well-developed and well-nourished. No distress.  HENT:  Head: Normocephalic and atraumatic.  Cardiovascular: Normal rate, regular rhythm and normal heart sounds.   No murmur heard.  No edema Pulmonary/Chest: Effort normal and breath sounds normal. No respiratory distress. No has no wheezes. No rales. Abdomen: Soft, nontender, nondistended MSK: No mid back pain with palpation, no CVA tenderness Skin: Skin is warm and dry. Not diaphoretic.       Assessment & Plan:    See Problem List for Assessment and Plan of chronic medical problems.    This visit occurred during the SARS-CoV-2 public health emergency.  Safety protocols were in place, including screening questions prior to the visit, additional usage of staff PPE, and extensive cleaning of exam room while observing appropriate contact time as indicated for disinfecting solutions.

## 2020-01-14 ENCOUNTER — Other Ambulatory Visit: Payer: Self-pay

## 2020-01-14 ENCOUNTER — Ambulatory Visit (INDEPENDENT_AMBULATORY_CARE_PROVIDER_SITE_OTHER): Payer: 59 | Admitting: Internal Medicine

## 2020-01-14 ENCOUNTER — Encounter: Payer: Self-pay | Admitting: Internal Medicine

## 2020-01-14 VITALS — BP 130/72 | HR 67 | Temp 98.1°F | Ht 60.0 in | Wt 152.0 lb

## 2020-01-14 DIAGNOSIS — N281 Cyst of kidney, acquired: Secondary | ICD-10-CM | POA: Diagnosis not present

## 2020-01-14 DIAGNOSIS — D649 Anemia, unspecified: Secondary | ICD-10-CM | POA: Diagnosis not present

## 2020-01-14 DIAGNOSIS — I10 Essential (primary) hypertension: Secondary | ICD-10-CM

## 2020-01-14 DIAGNOSIS — R748 Abnormal levels of other serum enzymes: Secondary | ICD-10-CM

## 2020-01-14 DIAGNOSIS — R1013 Epigastric pain: Secondary | ICD-10-CM

## 2020-01-14 DIAGNOSIS — R109 Unspecified abdominal pain: Secondary | ICD-10-CM | POA: Insufficient documentation

## 2020-01-14 LAB — CBC WITH DIFFERENTIAL/PLATELET
Absolute Monocytes: 363 cells/uL (ref 200–950)
Basophils Absolute: 20 cells/uL (ref 0–200)
Basophils Relative: 0.3 %
Eosinophils Absolute: 92 cells/uL (ref 15–500)
Eosinophils Relative: 1.4 %
HCT: 37.7 % (ref 35.0–45.0)
Hemoglobin: 11.1 g/dL — ABNORMAL LOW (ref 11.7–15.5)
Lymphs Abs: 2185 cells/uL (ref 850–3900)
MCH: 21.5 pg — ABNORMAL LOW (ref 27.0–33.0)
MCHC: 29.4 g/dL — ABNORMAL LOW (ref 32.0–36.0)
MCV: 72.9 fL — ABNORMAL LOW (ref 80.0–100.0)
Monocytes Relative: 5.5 %
Neutro Abs: 3940 cells/uL (ref 1500–7800)
Neutrophils Relative %: 59.7 %
Platelets: 545 10*3/uL — ABNORMAL HIGH (ref 140–400)
RBC: 5.17 10*6/uL — ABNORMAL HIGH (ref 3.80–5.10)
RDW: 17.2 % — ABNORMAL HIGH (ref 11.0–15.0)
Total Lymphocyte: 33.1 %
WBC: 6.6 10*3/uL (ref 3.8–10.8)

## 2020-01-14 LAB — FOLATE: Folate: 23.1 ng/mL

## 2020-01-14 MED ORDER — LISINOPRIL 20 MG PO TABS
20.0000 mg | ORAL_TABLET | Freq: Every day | ORAL | 3 refills | Status: DC
Start: 2020-01-14 — End: 2021-01-25

## 2020-01-14 MED ORDER — AMLODIPINE BESYLATE 5 MG PO TABS
5.0000 mg | ORAL_TABLET | Freq: Every day | ORAL | 3 refills | Status: DC
Start: 2020-01-14 — End: 2021-03-02

## 2020-01-14 NOTE — Assessment & Plan Note (Signed)
Chronic Stable since her last visit Will recheck CBC today and monitor She has been anemic most of her life and this is likely genetic in nature-we will monitor

## 2020-01-14 NOTE — Assessment & Plan Note (Signed)
Chronic Blood pressure well controlled Calcium level is elevated which could be from the hydrochlorothiazide so we will discontinue that Continue lisinopril 20 mg daily Start amlodipine 5 mg daily She can monitor BP at home and let me know if there is any concerns Follow-up in 3 months

## 2020-01-14 NOTE — Addendum Note (Signed)
Addended by: Simmie Davies on: 01/14/2020 09:19 AM   Modules accepted: Orders

## 2020-01-14 NOTE — Assessment & Plan Note (Signed)
Acute Cyst of left kidney seen on abdominal ultrasound Will obtain CT of kidneys to evaluate further

## 2020-01-14 NOTE — Assessment & Plan Note (Signed)
Chronic She states she has had high calcium levels for a while ? Related to hydrochlorothiazide She is not currently taking any calcium supplementation Discontinue hydrochlorothiazide Will recheck calcium level in 3 months at her next appointment

## 2020-01-14 NOTE — Assessment & Plan Note (Signed)
Acute She was seen last week for abdominal pain that was in the epigastric region associated with some mid back pain Changing to a very bland diet and taking Tylenol has improved her symptoms at this point she denies any abdominal pain and back pain Denies reflux Never started the pantoprazole Ultrasound of abdomen showed normal gallbladder ? Gallbladder dysfunction versus gastritis/atypical GERD versus mild pancreatitis since her lipase was elevated Recheck CMP, amylase, lipase Will be getting a CT of her kidneys, which may also evaluate the pancreas, but at this point no further evaluation since her symptoms have improved so significantly

## 2020-01-14 NOTE — Patient Instructions (Addendum)
  Blood work was ordered.     Medications reviewed and updated.  Changes include :   Stop lisinopril-hctz and start lisinopril and amlodipine.    A CT scan was ordered to evaluate your kidney cyst  Continue bland diet.

## 2020-01-14 NOTE — Assessment & Plan Note (Signed)
Lipase elevated last week in the setting of abdominal pain Abdominal pain has improved Will recheck amylase, lipase Continue bland diet

## 2020-01-14 NOTE — Addendum Note (Signed)
Addended by: Vandy Tsuchiya L on: 01/14/2020 09:19 AM   Modules accepted: Orders  

## 2020-01-15 LAB — CBC WITH DIFFERENTIAL/PLATELET
Absolute Monocytes: 355 cells/uL (ref 200–950)
Basophils Absolute: 28 cells/uL (ref 0–200)
Basophils Relative: 0.4 %
Eosinophils Absolute: 128 cells/uL (ref 15–500)
Eosinophils Relative: 1.8 %
HCT: 37.3 % (ref 35.0–45.0)
Hemoglobin: 10.9 g/dL — ABNORMAL LOW (ref 11.7–15.5)
Lymphs Abs: 2386 cells/uL (ref 850–3900)
MCH: 21.2 pg — ABNORMAL LOW (ref 27.0–33.0)
MCHC: 29.2 g/dL — ABNORMAL LOW (ref 32.0–36.0)
MCV: 72.7 fL — ABNORMAL LOW (ref 80.0–100.0)
Monocytes Relative: 5 %
Neutro Abs: 4203 cells/uL (ref 1500–7800)
Neutrophils Relative %: 59.2 %
Platelets: 538 10*3/uL — ABNORMAL HIGH (ref 140–400)
RBC: 5.13 10*6/uL — ABNORMAL HIGH (ref 3.80–5.10)
RDW: 17.1 % — ABNORMAL HIGH (ref 11.0–15.0)
Total Lymphocyte: 33.6 %
WBC: 7.1 10*3/uL (ref 3.8–10.8)

## 2020-01-15 LAB — COMPLETE METABOLIC PANEL WITH GFR
AG Ratio: 1.2 (calc) (ref 1.0–2.5)
ALT: 17 U/L (ref 6–29)
AST: 16 U/L (ref 10–35)
Albumin: 4.5 g/dL (ref 3.6–5.1)
Alkaline phosphatase (APISO): 62 U/L (ref 37–153)
BUN: 20 mg/dL (ref 7–25)
CO2: 27 mmol/L (ref 20–32)
Calcium: 11.4 mg/dL — ABNORMAL HIGH (ref 8.6–10.4)
Chloride: 101 mmol/L (ref 98–110)
Creat: 0.8 mg/dL (ref 0.50–0.99)
GFR, Est African American: 91 mL/min/{1.73_m2} (ref >=60)
GFR, Est Non African American: 78 mL/min/{1.73_m2} (ref >=60)
Globulin: 3.8 g/dL (calc) — ABNORMAL HIGH (ref 1.9–3.7)
Glucose, Bld: 131 mg/dL — ABNORMAL HIGH (ref 65–99)
Potassium: 4.1 mmol/L (ref 3.5–5.3)
Sodium: 139 mmol/L (ref 135–146)
Total Bilirubin: 0.5 mg/dL (ref 0.2–1.2)
Total Protein: 8.3 g/dL — ABNORMAL HIGH (ref 6.1–8.1)

## 2020-01-15 LAB — VITAMIN B12: Vitamin B-12: 1176 pg/mL — ABNORMAL HIGH (ref 200–1100)

## 2020-01-27 ENCOUNTER — Ambulatory Visit: Payer: Self-pay | Admitting: Internal Medicine

## 2020-02-05 ENCOUNTER — Ambulatory Visit (HOSPITAL_COMMUNITY): Admission: RE | Admit: 2020-02-05 | Payer: 59 | Source: Ambulatory Visit

## 2020-02-11 ENCOUNTER — Telehealth: Payer: Self-pay | Admitting: Internal Medicine

## 2020-02-11 NOTE — Telephone Encounter (Signed)
signed

## 2020-02-11 NOTE — Telephone Encounter (Signed)
Patient requesting [employer] form completed and signed by Dr. Lawerance Bach.  Patient needs form submitted by 9.6.21.  Form placed in Dr. Lawerance Bach box

## 2020-02-12 NOTE — Telephone Encounter (Addendum)
° °  Patient wants to know if [employer] form can be emailed or faxed today.

## 2020-02-12 NOTE — Telephone Encounter (Signed)
Follow up message  Patient requesting form be faxed to [phone] or email to [email or web address]

## 2020-04-19 ENCOUNTER — Other Ambulatory Visit: Payer: Self-pay | Admitting: Internal Medicine

## 2020-04-29 ENCOUNTER — Ambulatory Visit: Payer: 59 | Admitting: Internal Medicine

## 2020-05-08 DIAGNOSIS — D75839 Thrombocytosis, unspecified: Secondary | ICD-10-CM | POA: Insufficient documentation

## 2020-05-08 NOTE — Progress Notes (Signed)
Subjective:    Patient ID: Marcia Garcia, female    DOB: 06-12-1956, 64 y.o.   MRN: [id number]  HPI The patient is here for follow up of their chronic medical problems, including htn, DM, hyperlipidemia, anemia, thrombocytosis.  She walks sometimes.     Medications and allergies reviewed with patient and updated if appropriate.  Patient Active Problem List   Diagnosis Date Noted  . Elevated lipase 01/14/2020  . Acquired cyst of kidney 01/14/2020  . Abdominal pain 01/14/2020  . Anemia 10/27/2019  . Hypercalcemia 10/27/2019  . Diabetes ([facility]) 07/28/2019  . Hyperlipidemia 07/28/2019  . Obese 01/25/2018  . Hypertensive disorder 01/28/2014    Current Outpatient Medications on File Prior to Visit  Medication Sig Dispense Refill  . acetaminophen (TYLENOL) 500 MG tablet Take 1,000 mg by mouth daily.    Marland Kitchen amLODipine (NORVASC) 5 MG tablet Take 1 tablet (5 mg total) by mouth daily. 90 tablet 3  . aspirin EC 81 MG tablet Take 1 tablet by mouth daily.    Marland Kitchen docusate sodium (COLACE) 50 MG capsule Take 50 mg by mouth 2 (two) times daily.    Marland Kitchen lisinopril (ZESTRIL) 20 MG tablet Take 1 tablet (20 mg total) by mouth daily. 90 tablet 3  . Multiple Vitamin (MULTIVITAMIN) tablet Take 1 tablet by mouth daily.    . pantoprazole (PROTONIX) 40 MG tablet Take 1 tablet (40 mg total) by mouth 2 (two) times daily before a meal. (Patient not taking: Reported on 01/14/2020) 60 tablet 0  . rosuvastatin (CRESTOR) 10 MG tablet Take 1 tablet (10 mg total) by mouth daily. 90 tablet 1  . SYNJARDY XR 10-998 MG TB24 TAKE 1 TABLET DAILY 90 tablet 0   No current facility-administered medications on file prior to visit.    Past Medical History:  Diagnosis Date  . Anemia   . Diabetes mellitus without complication ([facility])   . Hyperlipidemia   . Hypertension     Past Surgical History:  Procedure Laterality Date  . PARTIAL HYSTERECTOMY      Social History   Socioeconomic History  . Marital status:  Widowed    Spouse name: Not on file  . Number of children: Not on file  . Years of education: Not on file  . Highest education level: Not on file  Occupational History  . Not on file  Tobacco Use  . Smoking status: Never Smoker  . Smokeless tobacco: Never Used  Vaping Use  . Vaping Use: Never used  Substance and Sexual Activity  . Alcohol use: Never  . Drug use: Not Currently  . Sexual activity: Not Currently  Other Topics Concern  . Not on file  Social History Narrative  . Not on file   Social Determinants of Health   Financial Resource Strain:   . Difficulty of Paying Living Expenses: Not on file  Food Insecurity:   . Worried About Programme researcher, broadcasting/film/video in the Last Year: Not on file  . Ran Out of Food in the Last Year: Not on file  Transportation Needs:   . Lack of Transportation (Medical): Not on file  . Lack of Transportation (Non-Medical): Not on file  Physical Activity:   . Days of Exercise per Week: Not on file  . Minutes of Exercise per Session: Not on file  Stress:   . Feeling of Stress : Not on file  Social Connections:   . Frequency of Communication with Friends and Family: Not on file  .  Frequency of Social Gatherings with Friends and Family: Not on file  . Attends Religious Services: Not on file  . Active Member of Clubs or Organizations: Not on file  . Attends Banker Meetings: Not on file  . Marital Status: Not on file    Family History  Problem Relation Age of Onset  . Diabetes Mother   . Diabetes Maternal Grandmother   . Alzheimer's disease Maternal Grandmother   . Colon cancer Neg Hx   . Esophageal cancer Neg Hx   . Rectal cancer Neg Hx   . Stomach cancer Neg Hx     Review of Systems     Objective:  There were no vitals filed for this visit. BP Readings from Last 3 Encounters:  01/14/20 130/72  01/06/20 (!) 130/90  10/28/19 126/78   Wt Readings from Last 3 Encounters:  01/14/20 152 lb (68.9 kg)  01/06/20 152 lb 9.6 oz  (69.2 kg)  10/28/19 151 lb 12.8 oz (68.9 kg)   There is no height or weight on file to calculate BMI.   Physical Exam    Constitutional: Appears well-developed and well-nourished. No distress.  HENT:  Head: Normocephalic and atraumatic.  Neck: Neck supple. No tracheal deviation present. No thyromegaly present.  No cervical lymphadenopathy Cardiovascular: Normal rate, regular rhythm and normal heart sounds.   No murmur heard. No carotid bruit .  No edema Pulmonary/Chest: Effort normal and breath sounds normal. No respiratory distress. No has no wheezes. No rales.  Skin: Skin is warm and dry. Not diaphoretic.  Psychiatric: Normal mood and affect. Behavior is normal.      Assessment & Plan:    See Problem List for Assessment and Plan of chronic medical problems.    This visit occurred during the SARS-CoV-2 public health emergency.  Safety protocols were in place, including screening questions prior to the visit, additional usage of staff PPE, and extensive cleaning of exam room while observing appropriate contact time as indicated for disinfecting solutions.    This encounter was created in error - please disregard. This encounter was created in error - please disregard.

## 2020-05-08 NOTE — Patient Instructions (Signed)
  Blood work was ordered.     Medications changes include :     Your prescription(s) have been submitted to your pharmacy. Please take as directed and contact our office if you believe you are having problem(s) with the medication(s).   A referral was ordered for        Someone from their office will call you to schedule an appointment.    Please followup in 6 months  

## 2020-05-10 ENCOUNTER — Other Ambulatory Visit: Payer: Self-pay

## 2020-05-10 ENCOUNTER — Telehealth: Payer: Self-pay | Admitting: Internal Medicine

## 2020-05-10 ENCOUNTER — Encounter: Payer: 59 | Admitting: Internal Medicine

## 2020-05-10 MED ORDER — BLOOD GLUCOSE MONITOR KIT: PACK | 0 refills | Status: AC

## 2020-05-10 NOTE — Telephone Encounter (Signed)
Faxed

## 2020-05-10 NOTE — Telephone Encounter (Signed)
Prescription printed - please fax.

## 2020-05-10 NOTE — Addendum Note (Signed)
Addended by: Pincus Sanes on: 05/10/2020 12:17 PM   Modules accepted: Orders
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
## 2020-05-26 ENCOUNTER — Ambulatory Visit: Payer: 59 | Admitting: Internal Medicine

## 2020-05-31 NOTE — Progress Notes (Signed)
Subjective:    Patient ID: Marcia Garcia, female    DOB: 12-12-1955, 64 y.o.   MRN: [id number]  HPI The patient is here for follow up of their chronic medical problems, including htn, DM, hyperlipidemia, anemia, thrombocytosis.  She feels ok.  After she left here the pain she was experiencing went away and she tried to call back about getting the CT scan scheduled but she was not able to get in touch with someone and then since the pain resolved she forgot about it.  She walks sometimes -when she can.  Her mom now lives with her and she is taking care of her.     Medications and allergies reviewed with patient and updated if appropriate.  Patient Active Problem List   Diagnosis Date Noted  . Thrombocytosis 05/08/2020  . Elevated lipase 01/14/2020  . Acquired cyst of kidney 01/14/2020  . Abdominal pain 01/14/2020  . Anemia 10/27/2019  . Hypercalcemia 10/27/2019  . Diabetes ([place]) 07/28/2019  . Hyperlipidemia 07/28/2019  . Obese 01/25/2018  . Hypertension 01/28/2014    Current Outpatient Medications on File Prior to Visit  Medication Sig Dispense Refill  . acetaminophen (TYLENOL) 500 MG tablet Take 1,000 mg by mouth daily.    Marland Kitchen amLODipine (NORVASC) 5 MG tablet Take 1 tablet (5 mg total) by mouth daily. 90 tablet 3  . aspirin EC 81 MG tablet Take 1 tablet by mouth daily.    . blood glucose meter kit and supplies KIT One touch glucometer.  Use daily and as needed to check sugars as directed.  (FOR E11.9). 1 each 0  . docusate sodium (COLACE) 50 MG capsule Take 50 mg by mouth 2 (two) times daily.    Marland Kitchen lisinopril (ZESTRIL) 20 MG tablet Take 1 tablet (20 mg total) by mouth daily. 90 tablet 3  . Multiple Vitamin (MULTIVITAMIN) tablet Take 1 tablet by mouth daily.    . rosuvastatin (CRESTOR) 10 MG tablet Take 1 tablet (10 mg total) by mouth daily. 90 tablet 1  . SYNJARDY XR 10-998 MG TB24 TAKE 1 TABLET DAILY 90 tablet 0   No current facility-administered medications on file  prior to visit.    Past Medical History:  Diagnosis Date  . Anemia   . Diabetes mellitus without complication ([place])   . Hyperlipidemia   . Hypertension     Past Surgical History:  Procedure Laterality Date  . PARTIAL HYSTERECTOMY      Social History   Socioeconomic History  . Marital status: Widowed    Spouse name: Not on file  . Number of children: Not on file  . Years of education: Not on file  . Highest education level: Not on file  Occupational History  . Not on file  Tobacco Use  . Smoking status: Never Smoker  . Smokeless tobacco: Never Used  Vaping Use  . Vaping Use: Never used  Substance and Sexual Activity  . Alcohol use: Never  . Drug use: Not Currently  . Sexual activity: Not Currently  Other Topics Concern  . Not on file  Social History Narrative  . Not on file   Social Determinants of Health   Financial Resource Strain: Not on file  Food Insecurity: Not on file  Transportation Needs: Not on file  Physical Activity: Not on file  Stress: Not on file  Social Connections: Not on file    Family History  Problem Relation Age of Onset  . Diabetes Mother   . Diabetes Maternal  Grandmother   . Alzheimer's disease Maternal Grandmother   . Colon cancer Neg Hx   . Esophageal cancer Neg Hx   . Rectal cancer Neg Hx   . Stomach cancer Neg Hx     Review of Systems  Constitutional: Negative for appetite change, chills, diaphoresis, fever and unexpected weight change.  Respiratory: Negative for cough, shortness of breath and wheezing.   Cardiovascular: Negative for chest pain, palpitations and leg swelling.  Gastrointestinal: Negative for abdominal pain, blood in stool, constipation, diarrhea and nausea.       No gerd  Neurological: Negative for light-headedness and headaches.  Hematological: Does not bruise/bleed easily.       Objective:   Vitals:   06/01/20 1342  BP: 128/88  Pulse: 63  Temp: 98.2 F (36.8 C)  SpO2: 98%   BP Readings from  Last 3 Encounters:  06/01/20 128/88  01/14/20 130/72  01/06/20 (!) 130/90   Wt Readings from Last 3 Encounters:  06/01/20 153 lb 6.4 oz (69.6 kg)  01/14/20 152 lb (68.9 kg)  01/06/20 152 lb 9.6 oz (69.2 kg)   Body mass index is 29.96 kg/m.   Physical Exam    Constitutional: Appears well-developed and well-nourished. No distress.  HENT:  Head: Normocephalic and atraumatic.  Neck: Neck supple. No tracheal deviation present. No thyromegaly present.  No cervical lymphadenopathy Cardiovascular: Normal rate, regular rhythm and normal heart sounds.   No murmur heard. No carotid bruit .  No edema Pulmonary/Chest: Effort normal and breath sounds normal. No respiratory distress. No has no wheezes. No rales.  Abdomen: soft, NT, ND, no CVA tenderness Skin: Skin is warm and dry. Not diaphoretic.  Psychiatric: Normal mood and affect. Behavior is normal.      Assessment & Plan:    See Problem List for Assessment and Plan of chronic medical problems.    This visit occurred during the SARS-CoV-2 public health emergency.  Safety protocols were in place, including screening questions prior to the visit, additional usage of staff PPE, and extensive cleaning of exam room while observing appropriate contact time as indicated for disinfecting solutions.

## 2020-05-31 NOTE — Patient Instructions (Addendum)
  Blood work was ordered.     Medications changes include :   none  Your prescription(s) have been submitted to your pharmacy. Please take as directed and contact our office if you believe you are having problem(s) with the medication(s).     Please followup in 6 months for a physical   

## 2020-06-01 ENCOUNTER — Encounter: Payer: Self-pay | Admitting: Internal Medicine

## 2020-06-01 ENCOUNTER — Ambulatory Visit: Payer: 59 | Admitting: Internal Medicine

## 2020-06-01 ENCOUNTER — Ambulatory Visit (INDEPENDENT_AMBULATORY_CARE_PROVIDER_SITE_OTHER): Payer: 59 | Admitting: Internal Medicine

## 2020-06-01 ENCOUNTER — Other Ambulatory Visit: Payer: Self-pay

## 2020-06-01 VITALS — BP 128/88 | HR 63 | Temp 98.2°F | Ht 60.0 in | Wt 153.4 lb

## 2020-06-01 DIAGNOSIS — E119 Type 2 diabetes mellitus without complications: Secondary | ICD-10-CM

## 2020-06-01 DIAGNOSIS — I10 Essential (primary) hypertension: Secondary | ICD-10-CM | POA: Diagnosis not present

## 2020-06-01 DIAGNOSIS — E782 Mixed hyperlipidemia: Secondary | ICD-10-CM

## 2020-06-01 DIAGNOSIS — R748 Abnormal levels of other serum enzymes: Secondary | ICD-10-CM

## 2020-06-01 DIAGNOSIS — D649 Anemia, unspecified: Secondary | ICD-10-CM | POA: Diagnosis not present

## 2020-06-01 DIAGNOSIS — D75839 Thrombocytosis, unspecified: Secondary | ICD-10-CM

## 2020-06-01 LAB — CBC WITH DIFFERENTIAL/PLATELET
Basophils Absolute: 0 10*3/uL (ref 0.0–0.1)
Basophils Relative: 0.4 % (ref 0.0–3.0)
Eosinophils Absolute: 0.2 10*3/uL (ref 0.0–0.7)
Eosinophils Relative: 2.7 % (ref 0.0–5.0)
HCT: 36 % (ref 36.0–46.0)
Hemoglobin: 11.3 g/dL — ABNORMAL LOW (ref 12.0–15.0)
Lymphocytes Relative: 34 % (ref 12.0–46.0)
Lymphs Abs: 2.5 10*3/uL (ref 0.7–4.0)
MCHC: 31.3 g/dL (ref 30.0–36.0)
MCV: 67.4 fl — ABNORMAL LOW (ref 78.0–100.0)
Monocytes Absolute: 0.4 10*3/uL (ref 0.1–1.0)
Monocytes Relative: 5.3 % (ref 3.0–12.0)
Neutro Abs: 4.2 10*3/uL (ref 1.4–7.7)
Neutrophils Relative %: 57.6 % (ref 43.0–77.0)
Platelets: 439 10*3/uL — ABNORMAL HIGH (ref 150.0–400.0)
RBC: 5.35 Mil/uL — ABNORMAL HIGH (ref 3.87–5.11)
RDW: 18.5 % — ABNORMAL HIGH (ref 11.5–15.5)
WBC: 7.2 10*3/uL (ref 4.0–10.5)

## 2020-06-01 LAB — COMPREHENSIVE METABOLIC PANEL
ALT: 25 U/L (ref 0–35)
AST: 26 U/L (ref 0–37)
Albumin: 4.7 g/dL (ref 3.5–5.2)
Alkaline Phosphatase: 63 U/L (ref 39–117)
BUN: 16 mg/dL (ref 6–23)
CO2: 28 mEq/L (ref 19–32)
Calcium: 11 mg/dL — ABNORMAL HIGH (ref 8.4–10.5)
Chloride: 104 mEq/L (ref 96–112)
Creatinine, Ser: 0.69 mg/dL (ref 0.40–1.20)
GFR: 91.98 mL/min (ref >60.00)
Glucose, Bld: 75 mg/dL (ref 70–99)
Potassium: 4.1 mEq/L (ref 3.5–5.1)
Sodium: 139 mEq/L (ref 135–145)
Total Bilirubin: 0.9 mg/dL (ref 0.2–1.2)
Total Protein: 8.5 g/dL — ABNORMAL HIGH (ref 6.0–8.3)

## 2020-06-01 LAB — LIPID PANEL
Cholesterol: 132 mg/dL (ref 0–200)
HDL: 60.9 mg/dL (ref >39.00)
LDL Cholesterol: 59 mg/dL (ref 0–99)
NonHDL: 71.48
Total CHOL/HDL Ratio: 2
Triglycerides: 61 mg/dL (ref 0.0–149.0)
VLDL: 12.2 mg/dL (ref 0.0–40.0)

## 2020-06-01 LAB — LIPASE: Lipase: 40 U/L (ref 11.0–59.0)

## 2020-06-01 LAB — AMYLASE: Amylase: 55 U/L (ref 27–131)

## 2020-06-01 LAB — HEMOGLOBIN A1C: Hgb A1c MFr Bld: 6.2 % (ref 4.6–6.5)

## 2020-06-01 NOTE — Assessment & Plan Note (Signed)
Chronic Thought this could be related to hydrochlorothiazide and I discontinued it BMP, PTH today

## 2020-06-01 NOTE — Assessment & Plan Note (Signed)
Chronic CBC today Stressed that if her blood counts are still elevated she needs to see hematology.  She agrees that she will go to see the hematologist

## 2020-06-01 NOTE — Assessment & Plan Note (Signed)
Lipase was elevated at her last visit when she was experiencing pain-?  Past stone She did not end up having imaging because the pain resolved Check lipase today, amylase

## 2020-06-01 NOTE — Assessment & Plan Note (Signed)
Chronic Mild States she has had this most of her life, mother and daughter are also anemic CBC

## 2020-06-01 NOTE — Assessment & Plan Note (Signed)
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

## 2020-06-01 NOTE — Assessment & Plan Note (Signed)
Chronic Check lipid panel  Continue Crestor 10 mg daily Regular exercise and healthy diet encouraged  

## 2020-06-01 NOTE — Assessment & Plan Note (Signed)
Chronic BP well controlled Continue amlodipine 5 mg daily, lisinopril 20 mg daily cmp  

## 2020-06-03 LAB — PTH, INTACT AND CALCIUM
Calcium: 10.1 mg/dL (ref 8.6–10.4)
PTH: 82 pg/mL — ABNORMAL HIGH (ref 14–64)

## 2020-06-15 ENCOUNTER — Telehealth: Payer: Self-pay | Admitting: Internal Medicine

## 2020-06-15 NOTE — Telephone Encounter (Signed)
Please return  call to discuss lab results 

## 2020-06-15 NOTE — Telephone Encounter (Signed)
Spoke with patient today. 

## 2020-07-26 ENCOUNTER — Other Ambulatory Visit: Payer: Self-pay | Admitting: Internal Medicine

## 2020-09-02 ENCOUNTER — Other Ambulatory Visit: Payer: Self-pay | Admitting: Internal Medicine

## 2020-10-29 ENCOUNTER — Other Ambulatory Visit: Payer: Self-pay | Admitting: Internal Medicine

## 2020-12-01 ENCOUNTER — Encounter: Payer: 59 | Admitting: Internal Medicine

## 2020-12-08 NOTE — Progress Notes (Signed)
Subjective:    Patient ID: Marcia Garcia, female    DOB: [birth year], 65 y.o.   MRN: [id number]   This visit occurred during the SARS-CoV-2 public health emergency.  Safety protocols were in place, including screening questions prior to the visit, additional usage of staff PPE, and extensive cleaning of exam room while observing appropriate contact time as indicated for disinfecting solutions.    HPI She is here for a physical exam.   She has no concerns.  Medications and allergies reviewed with patient and updated if appropriate.  Patient Active Problem List   Diagnosis Date Noted   Thrombocytosis 05/08/2020   Acquired cyst of kidney 01/14/2020   Abdominal pain 01/14/2020   Anemia 10/27/2019   Hypercalcemia 10/27/2019   Diabetes ([place]) 07/28/2019   Hyperlipidemia 07/28/2019   Obese 01/25/2018   Hypertension 01/28/2014    Current Outpatient Medications on File Prior to Visit  Medication Sig Dispense Refill   amLODipine (NORVASC) 5 MG tablet Take 1 tablet (5 mg total) by mouth daily. 90 tablet 3   aspirin EC 81 MG tablet Take 1 tablet by mouth daily.     blood glucose meter kit and supplies KIT One touch glucometer.  Use daily and as needed to check sugars as directed.  (FOR E11.9). 1 each 0   docusate sodium (COLACE) 50 MG capsule Take 50 mg by mouth 2 (two) times daily.     lisinopril (ZESTRIL) 20 MG tablet Take 1 tablet (20 mg total) by mouth daily. 90 tablet 3   Multiple Vitamin (MULTIVITAMIN) tablet Take 1 tablet by mouth daily.     rosuvastatin (CRESTOR) 10 MG tablet TAKE 1 TABLET DAILY 90 tablet 1   SYNJARDY XR 10-998 MG TB24 TAKE 1 TABLET DAILY 90 tablet 0   No current facility-administered medications on file prior to visit.    Past Medical History:  Diagnosis Date   Anemia    Diabetes mellitus without complication ([place])    Hyperlipidemia    Hypertension     Past Surgical History:  Procedure Laterality Date   PARTIAL HYSTERECTOMY      Social  History   Socioeconomic History   Marital status: Widowed    Spouse name: Not on file   Number of children: Not on file   Years of education: Not on file   Highest education level: Not on file  Occupational History   Not on file  Tobacco Use   Smoking status: Never   Smokeless tobacco: Never  Vaping Use   Vaping Use: Never used  Substance and Sexual Activity   Alcohol use: Never   Drug use: Not Currently   Sexual activity: Not Currently  Other Topics Concern   Not on file  Social History Narrative   Not on file   Social Determinants of Health   Financial Resource Strain: Not on file  Food Insecurity: Not on file  Transportation Needs: Not on file  Physical Activity: Not on file  Stress: Not on file  Social Connections: Not on file    Family History  Problem Relation Age of Onset   Diabetes Mother    Diabetes Maternal Grandmother    Alzheimer's disease Maternal Grandmother    Colon cancer Neg Hx    Esophageal cancer Neg Hx    Rectal cancer Neg Hx    Stomach cancer Neg Hx     Review of Systems  Constitutional:  Negative for chills and fever.  Eyes:  Negative for visual disturbance.  Respiratory:  Negative for cough, shortness of breath and wheezing.   Cardiovascular:  Negative for chest pain, palpitations and leg swelling.  Gastrointestinal:  Negative for abdominal pain, blood in stool, constipation, diarrhea and nausea.       No gerd  Genitourinary:  Negative for dysuria.  Musculoskeletal:  Negative for arthralgias and back pain.  Skin:  Negative for rash.  Neurological:  Negative for dizziness, light-headedness, numbness and headaches.  Psychiatric/Behavioral:  Negative for dysphoric mood. The patient is not nervous/anxious.       Objective:   Vitals:   12/09/20 0936  BP: 132/86  Pulse: 78  Temp: 98.6 F (37 C)  SpO2: 99%   Filed Weights   12/09/20 0936  Weight: 147 lb (66.7 kg)   Body mass index is 28.71 kg/m.  BP Readings from Last 3  Encounters:  12/09/20 132/86  06/01/20 128/88  01/14/20 130/72    Wt Readings from Last 3 Encounters:  12/09/20 147 lb (66.7 kg)  06/01/20 153 lb 6.4 oz (69.6 kg)  01/14/20 152 lb (68.9 kg)     Physical Exam Constitutional: She appears well-developed and well-nourished. No distress.  HENT:  Head: Normocephalic and atraumatic.  Right Ear: External ear normal. Normal ear canal and TM Left Ear: External ear normal.  Normal ear canal and TM Mouth/Throat: Oropharynx is clear and moist.  Eyes: Conjunctivae and EOM are normal.  Neck: Neck supple. No tracheal deviation present. No thyromegaly present.  No carotid bruit  Cardiovascular: Normal rate, regular rhythm and normal heart sounds.   No murmur heard.  No edema. Pulmonary/Chest: Effort normal and breath sounds normal. No respiratory distress. She has no wheezes. She has no rales.  Breast: deferred   Abdominal: Soft. She exhibits no distension. There is no tenderness.  Lymphadenopathy: She has no cervical adenopathy.  Skin: Skin is warm and dry. She is not diaphoretic.  Psychiatric: She has a normal mood and affect. Her behavior is normal.    Diabetic Foot Exam - Simple   Simple Foot Form Diabetic Foot exam was performed with the following findings: Yes 12/09/2020 10:06 AM  Visual Inspection No deformities, no ulcerations, no other skin breakdown bilaterally: Yes Sensation Testing Intact to touch and monofilament testing bilaterally: Yes Pulse Check Posterior Tibialis and Dorsalis pulse intact bilaterally: Yes Comments         Assessment & Plan:   Physical exam: Screening blood work    ordered Immunizations  discussed shingrix Colonoscopy  Up to date  Mammogram  schedule for august Gyn  Up to date - august - Dr Rogue Bussing [facility] Eye exams  Up to date  -Dr Wynetta Emery [facility] eye dr in [facility] Exercise  walking 3/week, silver sneakers class Weight  overweight  Substance abuse  none      See Problem List  for Assessment and Plan of chronic medical problems.

## 2020-12-08 NOTE — Patient Instructions (Addendum)
Blood work was ordered.     Medications changes include :   none     Please followup in 6 months    Health Maintenance, Female Adopting a healthy lifestyle and getting preventive care are important in promoting health and wellness. Ask your health care provider about: The right schedule for you to have regular tests and exams. Things you can do on your own to prevent diseases and keep yourself healthy. What should I know about diet, weight, and exercise? Eat a healthy diet  Eat a diet that includes plenty of vegetables, fruits, low-fat dairy products, and lean protein. Do not eat a lot of foods that are high in solid fats, added sugars, or sodium.  Maintain a healthy weight Body mass index (BMI) is used to identify weight problems. It estimates body fat based on height and weight. Your health care provider can help determineyour BMI and help you achieve or maintain a healthy weight. Get regular exercise Get regular exercise. This is one of the most important things you can do for your health. Most adults should: Exercise for at least 150 minutes each week. The exercise should increase your heart rate and make you sweat (moderate-intensity exercise). Do strengthening exercises at least twice a week. This is in addition to the moderate-intensity exercise. Spend less time sitting. Even light physical activity can be beneficial. Watch cholesterol and blood lipids Have your blood tested for lipids and cholesterol at 65 years of age, then havethis test every 5 years. Have your cholesterol levels checked more often if: Your lipid or cholesterol levels are high. You are older than 65 years of age. You are at high risk for heart disease. What should I know about cancer screening? Depending on your health history and family history, you may need to have cancer screening at various ages. This may include screening for: Breast cancer. Cervical cancer. Colorectal cancer. Skin  cancer. Lung cancer. What should I know about heart disease, diabetes, and high blood pressure? Blood pressure and heart disease High blood pressure causes heart disease and increases the risk of stroke. This is more likely to develop in people who have high blood pressure readings, are of African descent, or are overweight. Have your blood pressure checked: Every 3-5 years if you are 18-39 years of age. Every year if you are 40 years old or older. Diabetes Have regular diabetes screenings. This checks your fasting blood sugar level. Have the screening done: Once every three years after age 40 if you are at a normal weight and have a low risk for diabetes. More often and at a younger age if you are overweight or have a high risk for diabetes. What should I know about preventing infection? Hepatitis B If you have a higher risk for hepatitis B, you should be screened for this virus. Talk with your health care provider to find out if you are at risk forhepatitis B infection. Hepatitis C Testing is recommended for: Everyone born from 1945 through 1965. Anyone with known risk factors for hepatitis C. Sexually transmitted infections (STIs) Get screened for STIs, including gonorrhea and chlamydia, if: You are sexually active and are younger than 65 years of age. You are older than 65 years of age and your health care provider tells you that you are at risk for this type of infection. Your sexual activity has changed since you were last screened, and you are at increased risk for chlamydia or gonorrhea. Ask your health care provider if you are   at risk. Ask your health care provider about whether you are at high risk for HIV. Your health care provider may recommend a prescription medicine to help prevent HIV infection. If you choose to take medicine to prevent HIV, you should first get tested for HIV. You should then be tested every 3 months for as long as you are taking the medicine. Pregnancy If  you are about to stop having your period (premenopausal) and you may become pregnant, seek counseling before you get pregnant. Take 400 to 800 micrograms (mcg) of folic acid every day if you become pregnant. Ask for birth control (contraception) if you want to prevent pregnancy. Osteoporosis and menopause Osteoporosis is a disease in which the bones lose minerals and strength with aging. This can result in bone fractures. If you are 65 years old or older, or if you are at risk for osteoporosis and fractures, ask your health care provider if you should: Be screened for bone loss. Take a calcium or vitamin D supplement to lower your risk of fractures. Be given hormone replacement therapy (HRT) to treat symptoms of menopause. Follow these instructions at home: Lifestyle Do not use any products that contain nicotine or tobacco, such as cigarettes, e-cigarettes, and chewing tobacco. If you need help quitting, ask your health care provider. Do not use street drugs. Do not share needles. Ask your health care provider for help if you need support or information about quitting drugs. Alcohol use Do not drink alcohol if: Your health care provider tells you not to drink. You are pregnant, may be pregnant, or are planning to become pregnant. If you drink alcohol: Limit how much you use to 0-1 drink a day. Limit intake if you are breastfeeding. Be aware of how much alcohol is in your drink. In the [place] one drink equals one 12 oz bottle of beer (355 mL), one 5 oz glass of wine (148 mL), or one 1 oz glass of hard liquor (44 mL). General instructions Schedule regular health, dental, and eye exams. Stay current with your vaccines. Tell your health care provider if: You often feel depressed. You have ever been abused or do not feel safe at home. Summary Adopting a healthy lifestyle and getting preventive care are important in promoting health and wellness. Follow your health care provider's  instructions about healthy diet, exercising, and getting tested or screened for diseases. Follow your health care provider's instructions on monitoring your cholesterol and blood pressure. This information is not intended to replace advice given to you by your health care provider. Make sure you discuss any questions you have with your healthcare provider. Document Revised: 05/22/2018 Document Reviewed: 05/22/2018 Elsevier Patient Education  2022 [employer] Inc[employer]  

## 2020-12-09 ENCOUNTER — Ambulatory Visit (INDEPENDENT_AMBULATORY_CARE_PROVIDER_SITE_OTHER): Payer: 59 | Admitting: Internal Medicine

## 2020-12-09 ENCOUNTER — Other Ambulatory Visit: Payer: Self-pay

## 2020-12-09 ENCOUNTER — Encounter: Payer: Self-pay | Admitting: Internal Medicine

## 2020-12-09 VITALS — BP 132/86 | HR 78 | Temp 98.6°F | Ht 60.0 in | Wt 147.0 lb

## 2020-12-09 DIAGNOSIS — D649 Anemia, unspecified: Secondary | ICD-10-CM

## 2020-12-09 DIAGNOSIS — E782 Mixed hyperlipidemia: Secondary | ICD-10-CM | POA: Diagnosis not present

## 2020-12-09 DIAGNOSIS — Z0000 Encounter for general adult medical examination without abnormal findings: Secondary | ICD-10-CM | POA: Diagnosis not present

## 2020-12-09 DIAGNOSIS — D75839 Thrombocytosis, unspecified: Secondary | ICD-10-CM | POA: Diagnosis not present

## 2020-12-09 DIAGNOSIS — E119 Type 2 diabetes mellitus without complications: Secondary | ICD-10-CM | POA: Diagnosis not present

## 2020-12-09 DIAGNOSIS — I10 Essential (primary) hypertension: Secondary | ICD-10-CM | POA: Diagnosis not present

## 2020-12-09 LAB — CBC WITH DIFFERENTIAL/PLATELET
Basophils Absolute: 0 10*3/uL (ref 0.0–0.1)
Basophils Relative: 0.5 % (ref 0.0–3.0)
Eosinophils Absolute: 0.1 10*3/uL (ref 0.0–0.7)
Eosinophils Relative: 1.9 % (ref 0.0–5.0)
HCT: 36 % (ref 36.0–46.0)
Hemoglobin: 11.4 g/dL — ABNORMAL LOW (ref 12.0–15.0)
Lymphocytes Relative: 32.2 % (ref 12.0–46.0)
Lymphs Abs: 2 10*3/uL (ref 0.7–4.0)
MCHC: 31.6 g/dL (ref 30.0–36.0)
MCV: 68.6 fl — ABNORMAL LOW (ref 78.0–100.0)
Monocytes Absolute: 0.3 10*3/uL (ref 0.1–1.0)
Monocytes Relative: 5.5 % (ref 3.0–12.0)
Neutro Abs: 3.8 10*3/uL (ref 1.4–7.7)
Neutrophils Relative %: 59.9 % (ref 43.0–77.0)
Platelets: 388 10*3/uL (ref 150.0–400.0)
RBC: 5.25 Mil/uL — ABNORMAL HIGH (ref 3.87–5.11)
RDW: 17.7 % — ABNORMAL HIGH (ref 11.5–15.5)
WBC: 6.3 10*3/uL (ref 4.0–10.5)

## 2020-12-09 LAB — COMPREHENSIVE METABOLIC PANEL
ALT: 20 U/L (ref 0–35)
AST: 22 U/L (ref 0–37)
Albumin: 4.6 g/dL (ref 3.5–5.2)
Alkaline Phosphatase: 68 U/L (ref 39–117)
BUN: 13 mg/dL (ref 6–23)
CO2: 26 mEq/L (ref 19–32)
Calcium: 11.2 mg/dL — ABNORMAL HIGH (ref 8.4–10.5)
Chloride: 105 mEq/L (ref 96–112)
Creatinine, Ser: 0.74 mg/dL (ref 0.40–1.20)
GFR: 85.43 mL/min (ref >60.00)
Glucose, Bld: 91 mg/dL (ref 70–99)
Potassium: 4.2 mEq/L (ref 3.5–5.1)
Sodium: 139 mEq/L (ref 135–145)
Total Bilirubin: 0.9 mg/dL (ref 0.2–1.2)
Total Protein: 8.3 g/dL (ref 6.0–8.3)

## 2020-12-09 LAB — LIPID PANEL
Cholesterol: 149 mg/dL (ref 0–200)
HDL: 58.5 mg/dL (ref >39.00)
LDL Cholesterol: 75 mg/dL (ref 0–99)
NonHDL: 90.49
Total CHOL/HDL Ratio: 3
Triglycerides: 76 mg/dL (ref 0.0–149.0)
VLDL: 15.2 mg/dL (ref 0.0–40.0)

## 2020-12-09 LAB — HEMOGLOBIN A1C: Hgb A1c MFr Bld: 5.8 % (ref 4.6–6.5)

## 2020-12-09 LAB — VITAMIN D 25 HYDROXY (VIT D DEFICIENCY, FRACTURES): VITD: 32 ng/mL (ref 30.00–100.00)

## 2020-12-09 LAB — TSH: TSH: 3.35 u[IU]/mL (ref 0.35–5.50)

## 2020-12-09 NOTE — Assessment & Plan Note (Signed)
Chronic Cbc Has deferred seeing heme in past - will see what cbc is today

## 2020-12-09 NOTE — Assessment & Plan Note (Signed)
Chronic Lab Results  Component Value Date   HGBA1C 6.2 06/01/2020   Controlled Continue synjardy xr 10-998 mg daily Check a1c

## 2020-12-09 NOTE — Assessment & Plan Note (Addendum)
Chronic ? Genetic - has been improving Cbc today

## 2020-12-09 NOTE — Addendum Note (Signed)
Addended by: Waldemar Dickens B on: 12/09/2020 10:10 AM   Modules accepted: Orders

## 2020-12-09 NOTE — Assessment & Plan Note (Signed)
Chronic BP well controlled Continue amlodipine 5 mg qd, lisinopril 20 mg qd cmp  

## 2020-12-09 NOTE — Assessment & Plan Note (Addendum)
Chronic Intact pth - elevated at last visit - will recheck pth, vitamin d and calcium today

## 2020-12-09 NOTE — Assessment & Plan Note (Signed)
Chronic Check lipid panel  Continue crestor 10 mg qd Regular exercise and healthy diet encouraged  

## 2020-12-11 LAB — PTH, INTACT AND CALCIUM
Calcium: 10.9 mg/dL — ABNORMAL HIGH (ref 8.6–10.4)
PTH: 113 pg/mL — ABNORMAL HIGH (ref 16–77)

## 2020-12-13 NOTE — Addendum Note (Signed)
Addended by: Pincus Sanes on: 12/13/2020 02:13 PM   Modules accepted: Orders

## 2020-12-14 ENCOUNTER — Telehealth: Payer: Self-pay | Admitting: Internal Medicine

## 2020-12-14 NOTE — Telephone Encounter (Signed)
Patient is requesting a call back in regards to recent lab work. Please advise  

## 2020-12-15 NOTE — Telephone Encounter (Signed)
Results given.

## 2021-01-24 ENCOUNTER — Other Ambulatory Visit: Payer: Self-pay | Admitting: Internal Medicine

## 2021-01-25 ENCOUNTER — Ambulatory Visit: Payer: 59 | Admitting: Endocrinology

## 2021-01-26 LAB — HM MAMMOGRAPHY

## 2021-02-23 ENCOUNTER — Encounter: Payer: Self-pay | Admitting: Internal Medicine

## 2021-02-23 NOTE — Progress Notes (Unsigned)
Outside notes received. Information abstracted. Notes sent to scan.  

## 2021-03-02 ENCOUNTER — Other Ambulatory Visit: Payer: Self-pay | Admitting: Internal Medicine

## 2021-03-11 ENCOUNTER — Other Ambulatory Visit: Payer: Self-pay | Admitting: Internal Medicine

## 2021-03-14 ENCOUNTER — Ambulatory Visit: Payer: 59 | Admitting: Endocrinology

## 2021-03-21 ENCOUNTER — Other Ambulatory Visit: Payer: Self-pay

## 2021-03-21 ENCOUNTER — Ambulatory Visit (INDEPENDENT_AMBULATORY_CARE_PROVIDER_SITE_OTHER): Payer: 59 | Admitting: Endocrinology

## 2021-03-21 ENCOUNTER — Encounter: Payer: Self-pay | Admitting: Endocrinology

## 2021-03-21 DIAGNOSIS — E210 Primary hyperparathyroidism: Secondary | ICD-10-CM

## 2021-03-21 NOTE — Progress Notes (Signed)
Subjective:    Patient ID: Marcia Garcia, female    DOB: 08/20/1955, 65 y.o.   MRN: [id number]  HPI Pt is referred by Dr Quay Burow, for hypercalcemia.  Pt was noted to have hypercalcemia in 2021.  she has never had osteoporosis, urolithiasis, thyroid probs, parathyroid probs, sarcoidosis, cancer, PUD, pancreatitis, or bony fracture.  He does not take vitamin-D or A supplements.  Pt denies taking antacids, Li++, or HCTZ.  Back pain is improved recently.   Past Medical History:  Diagnosis Date   Anemia    Diabetes mellitus without complication ([place])    Hyperlipidemia    Hypertension     Past Surgical History:  Procedure Laterality Date   PARTIAL HYSTERECTOMY      Social History   Socioeconomic History   Marital status: Widowed    Spouse name: Not on file   Number of children: Not on file   Years of education: Not on file   Highest education level: Not on file  Occupational History   Not on file  Tobacco Use   Smoking status: Never   Smokeless tobacco: Never  Vaping Use   Vaping Use: Never used  Substance and Sexual Activity   Alcohol use: Never   Drug use: Not Currently   Sexual activity: Not Currently  Other Topics Concern   Not on file  Social History Narrative   Not on file   Social Determinants of Health   Financial Resource Strain: Not on file  Food Insecurity: Not on file  Transportation Needs: Not on file  Physical Activity: Not on file  Stress: Not on file  Social Connections: Not on file  Intimate Partner Violence: Not on file    Current Outpatient Medications on File Prior to Visit  Medication Sig Dispense Refill   amLODipine (NORVASC) 5 MG tablet TAKE 1 TABLET DAILY 90 tablet 3   aspirin EC 81 MG tablet Take 1 tablet by mouth daily.     blood glucose meter kit and supplies KIT One touch glucometer.  Use daily and as needed to check sugars as directed.  (FOR E11.9). 1 each 0   docusate sodium (COLACE) 50 MG capsule Take 50 mg by mouth 2 (two)  times daily.     lisinopril (ZESTRIL) 20 MG tablet TAKE 1 TABLET DAILY 90 tablet 3   Multiple Vitamin (MULTIVITAMIN) tablet Take 1 tablet by mouth daily.     rosuvastatin (CRESTOR) 10 MG tablet TAKE ONE TABLET BY MOUTH EVERY DAY 90 tablet 1   SYNJARDY XR 10-998 MG TB24 TAKE ONE TABLET ONCE DAILY 90 tablet 3   No current facility-administered medications on file prior to visit.    Allergies  Allergen Reactions   Atorvastatin Hives    Family History  Problem Relation Age of Onset   Diabetes Mother    Diabetes Maternal Grandmother    Alzheimer's disease Maternal Grandmother    Colon cancer Neg Hx    Esophageal cancer Neg Hx    Rectal cancer Neg Hx    Stomach cancer Neg Hx    Hypercalcemia Neg Hx     BP 120/60 (BP Location: Right Arm, Patient Position: Sitting, Cuff Size: Large)   Pulse 71   Ht 5' (1.524 m)   Wt 150 lb 6.4 oz (68.2 kg)   SpO2 96%   BMI 29.37 kg/m     Review of Systems Denies weight loss and polyuria.    Objective:   Physical Exam VS: see vs page GEN: no  distress HEAD: head: no deformity eyes: no periorbital swelling, no proptosis external nose and ears are normal NECK: supple, thyroid is not enlarged CHEST WALL: no kyphosis LUNGS: clear to auscultation CV: reg rate and rhythm, no murmur.  MUSCULOSKELETAL: gait is normal and steady EXTEMITIES: no deformity.  Trace bilat leg edema NEURO:  readily moves all 4's.  sensation is intact to touch on all 4's SKIN:  Normal texture and temperature.  No rash or suspicious lesion is visible.   NODES:  None palpable at the neck PSYCH: alert, well-oriented.  Does not appear anxious nor depressed.   Lab Results  Component Value Date   PTH 113 (H) 12/09/2020   CALCIUM 10.9 (H) 12/09/2020   CALCIUM 11.2 (H) 12/09/2020    Lab Results  Component Value Date   ALT 20 12/09/2020   AST 22 12/09/2020   ALKPHOS 68 12/09/2020   BILITOT 0.9 12/09/2020   Lab Results  Component Value Date   TSH 3.35 12/09/2020    25-OH-Vit-D=32  I have reviewed outside records, and summarized: Pt was noted to have elevated Ca++, and referred here.  Wellness was addressed.  She was on HCTZ     Assessment & Plan:  Primary hyperparathyroidism, new to me.  If DEXA is OK, plan will be to follow HTN: Please continue the same 2 medications  Patient Instructions  Please call [phone] to schedule a Bone Density (DexaScan) a the [place] office at [place].  We'll let you know about the results.  If this is normal, we can continue to follow this.     Please come back for a follow-up appointment in 6 months.

## 2021-03-21 NOTE — Patient Instructions (Addendum)
Please call [phone] to schedule a Bone Density (DexaScan) a the [place] office at [address] [facility].  We'll let you know about the results.  If this is normal, we can continue to follow this.     Please come back for a follow-up appointment in 6 months.

## 2021-03-28 ENCOUNTER — Ambulatory Visit (INDEPENDENT_AMBULATORY_CARE_PROVIDER_SITE_OTHER)
Admission: RE | Admit: 2021-03-28 | Discharge: 2021-03-28 | Disposition: A | Discharge status: Home / Self Care | Payer: 59 | Source: Ambulatory Visit | Attending: Endocrinology | Admitting: Endocrinology

## 2021-03-28 ENCOUNTER — Other Ambulatory Visit: Payer: Self-pay

## 2021-03-28 DIAGNOSIS — E210 Primary hyperparathyroidism: Secondary | ICD-10-CM | POA: Diagnosis not present

## 2021-04-06 ENCOUNTER — Telehealth: Payer: Self-pay | Admitting: Internal Medicine

## 2021-04-06 NOTE — Telephone Encounter (Signed)
Patient states she is switching from commercial insurance to medicare on 05-12-2021  Patient states rx SYNJARDY XR 10-998 MG TB24 will be to expensive w/ the medicare insurance  Patient requesting a new rx for Metformin to replace SYNJARDY XR 10-998 MG TB24, due to it being more affordable  on medicare  Patient is requesting a call back to discuss options

## 2021-04-07 MED ORDER — METFORMIN HCL ER 500 MG PO TB24: 1000.0000 mg | ORAL_TABLET | Freq: Every day | ORAL | 1 refills | Status: DC

## 2021-04-07 NOTE — Telephone Encounter (Signed)
We will try just the metformin part of medication she is taking-she will take 2 pills with breakfast which will be 1000 mg-the same dose that she is taking now.  If her sugars are not controlled with that we will add another medication.  Prescription sent to pharmacy on file.

## 2021-04-07 NOTE — Telephone Encounter (Signed)
noted 

## 2021-06-15 ENCOUNTER — Ambulatory Visit: Payer: 59 | Admitting: Internal Medicine

## 2021-06-29 ENCOUNTER — Ambulatory Visit: Payer: Self-pay | Admitting: Internal Medicine

## 2021-06-30 ENCOUNTER — Encounter: Payer: Self-pay | Admitting: Internal Medicine

## 2021-06-30 NOTE — Progress Notes (Signed)
° ° ° ° °Subjective:  ° ° Patient ID: Marcia Garcia, female    DOB: 01/19/1956, 65 y.o.   MRN: [id number] ° °This visit occurred during the SARS-CoV-2 public health emergency.  Safety protocols were in place, including screening questions prior to the visit, additional usage of staff PPE, and extensive cleaning of exam room while observing appropriate contact time as indicated for disinfecting solutions.   ° ° °HPI °The patient is here for follow up of their chronic medical problems, including htn, DM, hld, anemia, thrombocytosis ° °She started working at [employer].  She is not exercising.   ° °Medications and allergies reviewed with patient and updated if appropriate. ° °Patient Active Problem List  ° Diagnosis Date Noted  ° Hyperparathyroidism, primary (HCC) 03/21/2021  ° Thrombocytosis 05/08/2020  ° Acquired cyst of kidney 01/14/2020  ° Anemia 10/27/2019  ° Hypercalcemia 10/27/2019  ° Diabetes (HCC) 07/28/2019  ° Hyperlipidemia 07/28/2019  ° Obese 01/25/2018  ° Hypertension 01/28/2014  ° ° °Current Outpatient Medications on File Prior to Visit  °Medication Sig Dispense Refill  ° amLODipine (NORVASC) 5 MG tablet TAKE 1 TABLET DAILY 90 tablet 3  ° aspirin EC 81 MG tablet Take 1 tablet by mouth daily.    ° blood glucose meter kit and supplies KIT One touch glucometer.  Use daily and as needed to check sugars as directed.  (FOR E11.9). 1 each 0  ° docusate sodium (COLACE) 50 MG capsule Take 50 mg by mouth 2 (two) times daily.    ° lisinopril (ZESTRIL) 20 MG tablet TAKE 1 TABLET DAILY 90 tablet 3  ° metFORMIN (GLUCOPHAGE XR) 500 MG 24 hr tablet Take 2 tablets (1,000 mg total) by mouth daily with breakfast. 180 tablet 1  ° Multiple Vitamin (MULTIVITAMIN) tablet Take 1 tablet by mouth daily.    ° rosuvastatin (CRESTOR) 10 MG tablet TAKE ONE TABLET BY MOUTH EVERY DAY 90 tablet 1  ° °No current facility-administered medications on file prior to visit.  ° ° °Past Medical History:  °Diagnosis Date  ° Anemia   ° Diabetes  mellitus without complication (HCC)   ° Hyperlipidemia   ° Hypertension   ° ° °Past Surgical History:  °Procedure Laterality Date  ° PARTIAL HYSTERECTOMY    ° ° °Social History  ° °Socioeconomic History  ° Marital status: Widowed  °  Spouse name: Not on file  ° Number of children: Not on file  ° Years of education: Not on file  ° Highest education level: Not on file  °Occupational History  ° Not on file  °Tobacco Use  ° Smoking status: Never  ° Smokeless tobacco: Never  °Vaping Use  ° Vaping Use: Never used  °Substance and Sexual Activity  ° Alcohol use: Never  ° Drug use: Not Currently  ° Sexual activity: Not Currently  °Other Topics Concern  ° Not on file  °Social History Narrative  ° Not on file  ° °Social Determinants of Health  ° °Financial Resource Strain: Not on file  °Food Insecurity: Not on file  °Transportation Needs: Not on file  °Physical Activity: Not on file  °Stress: Not on file  °Social Connections: Not on file  ° ° °Family History  °Problem Relation Age of Onset  ° Diabetes Mother   ° Diabetes Maternal Grandmother   ° Alzheimer's disease Maternal Grandmother   ° Colon cancer Neg Hx   ° Esophageal cancer Neg Hx   ° Rectal cancer Neg Hx   ° Stomach   Stomach cancer Neg Hx    Hypercalcemia Neg Hx     Review of Systems  Constitutional:  Negative for chills and fever.  Respiratory:  Negative for cough, shortness of breath and wheezing.   Cardiovascular:  Negative for chest pain, palpitations and leg swelling.  Neurological:  Negative for light-headedness and headaches.  Hematological:  Does not bruise/bleed easily.      Objective:   Vitals:   07/01/21 0928  BP: 126/70  Pulse: 77  Temp: 98.2 F (36.8 C)  SpO2: 96%   BP Readings from Last 3 Encounters:  07/01/21 126/70  03/21/21 120/60  12/09/20 132/86   Wt Readings from Last 3 Encounters:  07/01/21 152 lb (68.9 kg)  03/21/21 150 lb 6.4 oz (68.2 kg)  12/09/20 147 lb (66.7 kg)   Body mass index is 29.69 kg/m.   Physical Exam     Constitutional: Appears well-developed and well-nourished. No distress.  HENT:  Head: Normocephalic and atraumatic.  Neck: Neck supple. No tracheal deviation present. No thyromegaly present.  No cervical lymphadenopathy Cardiovascular: Normal rate, regular rhythm and normal heart sounds.   No murmur heard. No carotid bruit .  No edema Pulmonary/Chest: Effort normal and breath sounds normal. No respiratory distress. No has no wheezes. No rales.  Skin: Skin is warm and dry. Not diaphoretic.  Psychiatric: Normal mood and affect. Behavior is normal.      Assessment & Plan:    See Problem List for Assessment and Plan of chronic medical problems.

## 2021-06-30 NOTE — Patient Instructions (Addendum)
  Blood work was ordered.       Medications changes include :   None     Please followup in 6 months   

## 2021-07-01 ENCOUNTER — Other Ambulatory Visit: Payer: Self-pay

## 2021-07-01 ENCOUNTER — Ambulatory Visit (INDEPENDENT_AMBULATORY_CARE_PROVIDER_SITE_OTHER): Payer: PPO | Admitting: Internal Medicine

## 2021-07-01 VITALS — BP 126/70 | HR 77 | Temp 98.2°F | Ht 60.0 in | Wt 152.0 lb

## 2021-07-01 DIAGNOSIS — D75839 Thrombocytosis, unspecified: Secondary | ICD-10-CM

## 2021-07-01 DIAGNOSIS — D649 Anemia, unspecified: Secondary | ICD-10-CM | POA: Diagnosis not present

## 2021-07-01 DIAGNOSIS — E119 Type 2 diabetes mellitus without complications: Secondary | ICD-10-CM

## 2021-07-01 DIAGNOSIS — M85852 Other specified disorders of bone density and structure, left thigh: Secondary | ICD-10-CM

## 2021-07-01 DIAGNOSIS — E782 Mixed hyperlipidemia: Secondary | ICD-10-CM | POA: Diagnosis not present

## 2021-07-01 DIAGNOSIS — M8580 Other specified disorders of bone density and structure, unspecified site: Secondary | ICD-10-CM | POA: Insufficient documentation

## 2021-07-01 DIAGNOSIS — M85851 Other specified disorders of bone density and structure, right thigh: Secondary | ICD-10-CM

## 2021-07-01 DIAGNOSIS — I10 Essential (primary) hypertension: Secondary | ICD-10-CM | POA: Diagnosis not present

## 2021-07-01 LAB — LIPID PANEL
Cholesterol: 152 mg/dL (ref 0–200)
HDL: 56.9 mg/dL (ref >39.00)
LDL Cholesterol: 74 mg/dL (ref 0–99)
NonHDL: 94.96
Total CHOL/HDL Ratio: 3
Triglycerides: 103 mg/dL (ref 0.0–149.0)
VLDL: 20.6 mg/dL (ref 0.0–40.0)

## 2021-07-01 LAB — CBC WITH DIFFERENTIAL/PLATELET
Basophils Absolute: 0 10*3/uL (ref 0.0–0.1)
Basophils Relative: 0.3 % (ref 0.0–3.0)
Eosinophils Absolute: 0.2 10*3/uL (ref 0.0–0.7)
Eosinophils Relative: 2.5 % (ref 0.0–5.0)
HCT: 34.9 % — ABNORMAL LOW (ref 36.0–46.0)
Hemoglobin: 10.9 g/dL — ABNORMAL LOW (ref 12.0–15.0)
Lymphocytes Relative: 33.5 % (ref 12.0–46.0)
Lymphs Abs: 2.1 10*3/uL (ref 0.7–4.0)
MCHC: 31.3 g/dL (ref 30.0–36.0)
MCV: 69.4 fl — ABNORMAL LOW (ref 78.0–100.0)
Monocytes Absolute: 0.4 10*3/uL (ref 0.1–1.0)
Monocytes Relative: 6 % (ref 3.0–12.0)
Neutro Abs: 3.7 10*3/uL (ref 1.4–7.7)
Neutrophils Relative %: 57.7 % (ref 43.0–77.0)
Platelets: 379 10*3/uL (ref 150.0–400.0)
RBC: 5.03 Mil/uL (ref 3.87–5.11)
RDW: 17.7 % — ABNORMAL HIGH (ref 11.5–15.5)
WBC: 6.4 10*3/uL (ref 4.0–10.5)

## 2021-07-01 LAB — MICROALBUMIN / CREATININE URINE RATIO
Creatinine,U: 133.5 mg/dL
Microalb Creat Ratio: 0.9 mg/g (ref 0.0–30.0)
Microalb, Ur: 1.2 mg/dL (ref 0.0–1.9)

## 2021-07-01 LAB — COMPREHENSIVE METABOLIC PANEL
ALT: 28 U/L (ref 0–35)
AST: 26 U/L (ref 0–37)
Albumin: 4.7 g/dL (ref 3.5–5.2)
Alkaline Phosphatase: 64 U/L (ref 39–117)
BUN: 15 mg/dL (ref 6–23)
CO2: 29 mEq/L (ref 19–32)
Calcium: 10.8 mg/dL — ABNORMAL HIGH (ref 8.4–10.5)
Chloride: 102 mEq/L (ref 96–112)
Creatinine, Ser: 0.72 mg/dL (ref 0.40–1.20)
GFR: 87.94 mL/min (ref >60.00)
Glucose, Bld: 109 mg/dL — ABNORMAL HIGH (ref 70–99)
Potassium: 4.1 mEq/L (ref 3.5–5.1)
Sodium: 137 mEq/L (ref 135–145)
Total Bilirubin: 0.8 mg/dL (ref 0.2–1.2)
Total Protein: 8.4 g/dL — ABNORMAL HIGH (ref 6.0–8.3)

## 2021-07-01 LAB — HEMOGLOBIN A1C: Hgb A1c MFr Bld: 6.4 % (ref 4.6–6.5)

## 2021-07-01 NOTE — Assessment & Plan Note (Signed)
Chronic Blood pressure well controlled CMP Continue amlodipine 5 mg daily, lisinopril 20 mg daily 

## 2021-07-01 NOTE — Assessment & Plan Note (Signed)
History of anemia-mild Has improved over the past couple of years Asymptomatic CBC

## 2021-07-01 NOTE — Assessment & Plan Note (Addendum)
Chronic Check A1c, urine microalbumin Continue metformin XR 1000 mg daily Encouraged regular exercise and diabetic diet

## 2021-07-01 NOTE — Assessment & Plan Note (Signed)
Chronic Regular exercise and healthy diet encouraged Check lipid panel  Continue rosuvastatin 10 mg daily 

## 2021-07-01 NOTE — Assessment & Plan Note (Signed)
Chronic Has improved Check CBC

## 2021-07-06 ENCOUNTER — Telehealth: Payer: Self-pay

## 2021-07-06 NOTE — Telephone Encounter (Signed)
Pt called back to get results. Results given and pt expressed understanding.  FYI

## 2021-09-12 ENCOUNTER — Other Ambulatory Visit: Payer: Self-pay | Admitting: Internal Medicine

## 2021-09-26 ENCOUNTER — Ambulatory Visit: Payer: 59 | Admitting: Endocrinology

## 2021-11-15 ENCOUNTER — Other Ambulatory Visit: Payer: Self-pay | Admitting: Internal Medicine

## 2021-12-29 ENCOUNTER — Ambulatory Visit: Payer: PPO | Admitting: Internal Medicine

## 2022-01-03 ENCOUNTER — Ambulatory Visit: Payer: PPO | Admitting: Internal Medicine

## 2022-01-09 NOTE — Progress Notes (Unsigned)
Subjective:    Patient ID: Marcia Garcia, female    DOB: [birth year], 66 y.o.   MRN: [id number]     HPI Marcia Garcia is here for follow up of her chronic medical problems, including htn, DM, hld, anemia, thrombocytosis  She is taking all of her medications as prescribed.    She is eating healthy - low carbs.  Very active.   Medications and allergies reviewed with patient and updated if appropriate.  Current Outpatient Medications on File Prior to Visit  Medication Sig Dispense Refill   amLODipine (NORVASC) 5 MG tablet TAKE 1 TABLET DAILY 90 tablet 3   aspirin EC 81 MG tablet Take 1 tablet by mouth daily.     blood glucose meter kit and supplies KIT One touch glucometer.  Use daily and as needed to check sugars as directed.  (FOR E11.9). 1 each 0   Cholecalciferol (VITAMIN D) 50 MCG (2000 UT) CAPS Take by mouth.     docusate sodium (COLACE) 50 MG capsule Take 50 mg by mouth 2 (two) times daily.     lisinopril (ZESTRIL) 20 MG tablet TAKE 1 TABLET DAILY 90 tablet 3   metFORMIN (GLUCOPHAGE-XR) 500 MG 24 hr tablet TAKE TWO TABLETS ONCE DAILY WITH BREAKFAST 180 tablet 1   Multiple Vitamin (MULTIVITAMIN) tablet Take 1 tablet by mouth daily.     rosuvastatin (CRESTOR) 10 MG tablet TAKE ONE TABLET BY MOUTH EVERY DAY 90 tablet 1   No current facility-administered medications on file prior to visit.     Review of Systems  Constitutional:  Negative for fatigue and fever.  Respiratory:  Negative for cough, shortness of breath and wheezing.   Cardiovascular:  Negative for chest pain, palpitations and leg swelling.  Neurological:  Negative for light-headedness and headaches.       Objective:   Vitals:   01/10/22 1107  BP: 120/80  Pulse: 73  Temp: 98 F (36.7 C)  SpO2: 95%   BP Readings from Last 3 Encounters:  01/10/22 120/80  07/01/21 126/70  03/21/21 120/60   Wt Readings from Last 3 Encounters:  01/10/22 152 lb (68.9 kg)  07/01/21 152 lb (68.9 kg)  03/21/21 150 lb  6.4 oz (68.2 kg)   Body mass index is 29.69 kg/m.    Physical Exam Constitutional:      General: She is not in acute distress.    Appearance: Normal appearance.  HENT:     Head: Normocephalic and atraumatic.  Eyes:     Conjunctiva/sclera: Conjunctivae normal.  Cardiovascular:     Rate and Rhythm: Normal rate and regular rhythm.     Heart sounds: Normal heart sounds. No murmur heard. Pulmonary:     Effort: Pulmonary effort is normal. No respiratory distress.     Breath sounds: Normal breath sounds. No wheezing.  Musculoskeletal:     Cervical back: Neck supple.     Right lower leg: No edema.     Left lower leg: No edema.  Lymphadenopathy:     Cervical: No cervical adenopathy.  Skin:    General: Skin is warm and dry.     Findings: No rash.  Neurological:     Mental Status: She is alert. Mental status is at baseline.  Psychiatric:        Mood and Affect: Mood normal.        Behavior: Behavior normal.        Lab Results  Component Value Date   WBC 6.4 07/01/2021  HGB 10.9 (L) 07/01/2021   HCT 34.9 (L) 07/01/2021   PLT 379.0 07/01/2021   GLUCOSE 109 (H) 07/01/2021   CHOL 152 07/01/2021   TRIG 103.0 07/01/2021   HDL 56.90 07/01/2021   LDLCALC 74 07/01/2021   ALT 28 07/01/2021   AST 26 07/01/2021   NA 137 07/01/2021   K 4.1 07/01/2021   CL 102 07/01/2021   CREATININE 0.72 07/01/2021   BUN 15 07/01/2021   CO2 29 07/01/2021   TSH 3.35 12/09/2020   HGBA1C 6.4 07/01/2021   MICROALBUR 1.2 07/01/2021     Assessment & Plan:    See Problem List for Assessment and Plan of chronic medical problems.

## 2022-01-09 NOTE — Assessment & Plan Note (Signed)
Saw Dr Everardo All dexa was ok so advised monitoring

## 2022-01-09 NOTE — Patient Instructions (Addendum)
     Blood work was ordered.     Medications changes include :   none   Your prescription(s) have been sent to your pharmacy.     Return in about 6 months (around 07/13/2022) for Physical Exam.  

## 2022-01-10 ENCOUNTER — Encounter: Payer: Self-pay | Admitting: Internal Medicine

## 2022-01-10 ENCOUNTER — Ambulatory Visit (INDEPENDENT_AMBULATORY_CARE_PROVIDER_SITE_OTHER): Payer: PPO | Admitting: Internal Medicine

## 2022-01-10 VITALS — BP 120/80 | HR 73 | Temp 98.0°F | Wt 152.0 lb

## 2022-01-10 DIAGNOSIS — E119 Type 2 diabetes mellitus without complications: Secondary | ICD-10-CM

## 2022-01-10 DIAGNOSIS — I10 Essential (primary) hypertension: Secondary | ICD-10-CM | POA: Diagnosis not present

## 2022-01-10 DIAGNOSIS — D75839 Thrombocytosis, unspecified: Secondary | ICD-10-CM | POA: Diagnosis not present

## 2022-01-10 DIAGNOSIS — D649 Anemia, unspecified: Secondary | ICD-10-CM | POA: Diagnosis not present

## 2022-01-10 DIAGNOSIS — E210 Primary hyperparathyroidism: Secondary | ICD-10-CM

## 2022-01-10 DIAGNOSIS — E782 Mixed hyperlipidemia: Secondary | ICD-10-CM | POA: Diagnosis not present

## 2022-01-10 LAB — COMPREHENSIVE METABOLIC PANEL
ALT: 20 U/L (ref 0–35)
AST: 19 U/L (ref 0–37)
Albumin: 4.7 g/dL (ref 3.5–5.2)
Alkaline Phosphatase: 62 U/L (ref 39–117)
BUN: 12 mg/dL (ref 6–23)
CO2: 28 mEq/L (ref 19–32)
Calcium: 10.9 mg/dL — ABNORMAL HIGH (ref 8.4–10.5)
Chloride: 105 mEq/L (ref 96–112)
Creatinine, Ser: 0.72 mg/dL (ref 0.40–1.20)
GFR: 87.62 mL/min (ref >60.00)
Glucose, Bld: 96 mg/dL (ref 70–99)
Potassium: 5.1 mEq/L (ref 3.5–5.1)
Sodium: 138 mEq/L (ref 135–145)
Total Bilirubin: 0.7 mg/dL (ref 0.2–1.2)
Total Protein: 8.3 g/dL (ref 6.0–8.3)

## 2022-01-10 LAB — CBC WITH DIFFERENTIAL/PLATELET
Basophils Absolute: 0 10*3/uL (ref 0.0–0.1)
Basophils Relative: 0.5 % (ref 0.0–3.0)
Eosinophils Absolute: 0.1 10*3/uL (ref 0.0–0.7)
Eosinophils Relative: 1.8 % (ref 0.0–5.0)
HCT: 35.8 % — ABNORMAL LOW (ref 36.0–46.0)
Hemoglobin: 11.1 g/dL — ABNORMAL LOW (ref 12.0–15.0)
Lymphocytes Relative: 31.9 % (ref 12.0–46.0)
Lymphs Abs: 2 10*3/uL (ref 0.7–4.0)
MCHC: 31 g/dL (ref 30.0–36.0)
MCV: 69.5 fl — ABNORMAL LOW (ref 78.0–100.0)
Monocytes Absolute: 0.4 10*3/uL (ref 0.1–1.0)
Monocytes Relative: 6.1 % (ref 3.0–12.0)
Neutro Abs: 3.7 10*3/uL (ref 1.4–7.7)
Neutrophils Relative %: 59.7 % (ref 43.0–77.0)
Platelets: 343 10*3/uL (ref 150.0–400.0)
RBC: 5.16 Mil/uL — ABNORMAL HIGH (ref 3.87–5.11)
RDW: 16.4 % — ABNORMAL HIGH (ref 11.5–15.5)
WBC: 6.1 10*3/uL (ref 4.0–10.5)

## 2022-01-10 LAB — LIPID PANEL
Cholesterol: 148 mg/dL (ref 0–200)
HDL: 55.4 mg/dL (ref >39.00)
LDL Cholesterol: 74 mg/dL (ref 0–99)
NonHDL: 92.98
Total CHOL/HDL Ratio: 3
Triglycerides: 93 mg/dL (ref 0.0–149.0)
VLDL: 18.6 mg/dL (ref 0.0–40.0)

## 2022-01-10 LAB — HEMOGLOBIN A1C: Hgb A1c MFr Bld: 6.2 % (ref 4.6–6.5)

## 2022-01-10 NOTE — Assessment & Plan Note (Signed)
Chronic Blood pressure well controlled CMP Continue amlodipine 5 mg daily, lisinopril 20 mg daily 

## 2022-01-10 NOTE — Assessment & Plan Note (Signed)
Chronic Regular exercise and healthy diet encouraged Check lipid panel  Continue Crestor 10 mg daily 

## 2022-01-10 NOTE — Assessment & Plan Note (Addendum)
Chronic  Lab Results  Component Value Date   HGBA1C 6.4 07/01/2021   Sugars well controlled Testing sugars 0 times a day Check A1c Continue metformin XR 500 mg - 2 tablets once daily Stressed regular exercise, diabetic diet

## 2022-01-10 NOTE — Assessment & Plan Note (Signed)
Chronic Improved CBC

## 2022-01-10 NOTE — Assessment & Plan Note (Signed)
Chronic Likely genetic Hepatic CBC

## 2022-01-14 LAB — PTH, INTACT AND CALCIUM
Calcium: 10.9 mg/dL — ABNORMAL HIGH (ref 8.6–10.4)
PTH: 86 pg/mL — ABNORMAL HIGH (ref 16–77)

## 2022-01-17 ENCOUNTER — Telehealth: Payer: Self-pay | Admitting: Internal Medicine

## 2022-01-17 NOTE — Telephone Encounter (Signed)
Pt is requesting her lab results from 01/10/22 be mailed to her house. She stated she never received a call with her results so she would like the results mailed to her.   Please advise

## 2022-01-17 NOTE — Telephone Encounter (Signed)
Spoke with patient and labs mailed out today.

## 2022-02-01 ENCOUNTER — Other Ambulatory Visit: Payer: Self-pay | Admitting: Internal Medicine

## 2022-02-10 ENCOUNTER — Telehealth: Payer: Self-pay

## 2022-02-10 NOTE — Telephone Encounter (Signed)
LM for pt to call and schedule Initial Medicare Wellness Visit with either [facility] Health Advisor or Nurse AWV. Pt can be scheduled at any time.

## 2022-02-17 DIAGNOSIS — Z1231 Encounter for screening mammogram for malignant neoplasm of breast: Secondary | ICD-10-CM | POA: Diagnosis not present

## 2022-02-17 DIAGNOSIS — Z01419 Encounter for gynecological examination (general) (routine) without abnormal findings: Secondary | ICD-10-CM | POA: Diagnosis not present

## 2022-03-14 ENCOUNTER — Other Ambulatory Visit: Payer: Self-pay | Admitting: Internal Medicine

## 2022-04-28 ENCOUNTER — Other Ambulatory Visit: Payer: Self-pay | Admitting: Internal Medicine

## 2022-05-16 ENCOUNTER — Ambulatory Visit (INDEPENDENT_AMBULATORY_CARE_PROVIDER_SITE_OTHER): Payer: PPO

## 2022-05-16 VITALS — Ht 60.0 in

## 2022-05-16 DIAGNOSIS — Z0000 Encounter for general adult medical examination without abnormal findings: Secondary | ICD-10-CM | POA: Diagnosis not present

## 2022-05-16 NOTE — Patient Instructions (Signed)
Marcia Garcia , Thank you for taking time to come for your Medicare Wellness Visit. I appreciate your ongoing commitment to your health goals. Please review the following plan we discussed and let me know if I can assist you in the future.   These are the goals we discussed:  Goals      My goal is to keep my HgA1C under <7 and maintain my health by staying independent.     "I have a provider that cares for my health and that makes me care for my health."        This is a list of the screening recommended for you and due dates:  Health Maintenance  Topic Date Due   Eye exam for diabetics  Never done   Zoster (Shingles) Vaccine (1 of 2) Never done   Pneumonia Vaccine (1 - PCV) Never done   Complete foot exam   12/09/2021   COVID-19 Vaccine (5 - 2023-24 season) 02/10/2022   Flu Shot  09/10/2022*   Yearly kidney health urinalysis for diabetes  07/01/2022   Hemoglobin A1C  07/13/2022   DTaP/Tdap/Td vaccine (2 - Td or Tdap) 07/29/2022   Yearly kidney function blood test for diabetes  01/11/2023   Mammogram  01/27/2023   Medicare Annual Wellness Visit  05/17/2023   DEXA scan (bone density measurement)  03/28/2024   Colon Cancer Screening  04/15/2028   HPV Vaccine  Aged Out   Hepatitis C Screening: USPSTF Recommendation to screen - Ages 21-79 yo.  Discontinued   HIV Screening  Discontinued  *Topic was postponed. The date shown is not the original due date.    Advanced directives: No  Conditions/risks identified: Yes; Type II Diabetes Mellitus  Next appointment: Follow up in one year for your annual wellness visit by calling [phone] to schedule with Nurse Percell Miller.   Preventive Care 66 Years and Older, Female Preventive care refers to lifestyle choices and visits with your health care provider that can promote health and wellness. What does preventive care include? A yearly physical exam. This is also called an annual well check. Dental exams once or twice a year. Routine eye  exams. Ask your health care provider how often you should have your eyes checked. Personal lifestyle choices, including: Daily care of your teeth and gums. Regular physical activity. Eating a healthy diet. Avoiding tobacco and drug use. Limiting alcohol use. Practicing safe sex. Taking low-dose aspirin every day. Taking vitamin and mineral supplements as recommended by your health care provider. What happens during an annual well check? The services and screenings done by your health care provider during your annual well check will depend on your age, overall health, lifestyle risk factors, and family history of disease. Counseling  Your health care provider may ask you questions about your: Alcohol use. Tobacco use. Drug use. Emotional well-being. Home and relationship well-being. Sexual activity. Eating habits. History of falls. Memory and ability to understand (cognition). Work and work Astronomer. Reproductive health. Screening  You may have the following tests or measurements: Height, weight, and BMI. Blood pressure. Lipid and cholesterol levels. These may be checked every 5 years, or more frequently if you are over 59 years old. Skin check. Lung cancer screening. You may have this screening every year starting at age 22 if you have a 30-pack-year history of smoking and currently smoke or have quit within the past 15 years. Fecal occult blood test (FOBT) of the stool. You may have this test every year starting at age  50. Flexible sigmoidoscopy or colonoscopy. You may have a sigmoidoscopy every 5 years or a colonoscopy every 10 years starting at age 47. Hepatitis C blood test. Hepatitis B blood test. Sexually transmitted disease (STD) testing. Diabetes screening. This is done by checking your blood sugar (glucose) after you have not eaten for a while (fasting). You may have this done every 1-3 years. Bone density scan. This is done to screen for osteoporosis. You may have  this done starting at age 44. Mammogram. This may be done every 1-2 years. Talk to your health care provider about how often you should have regular mammograms. Talk with your health care provider about your test results, treatment options, and if necessary, the need for more tests. Vaccines  Your health care provider may recommend certain vaccines, such as: Influenza vaccine. This is recommended every year. Tetanus, diphtheria, and acellular pertussis (Tdap, Td) vaccine. You may need a Td booster every 10 years. Zoster vaccine. You may need this after age 66. Pneumococcal 13-valent conjugate (PCV13) vaccine. One dose is recommended after age 21. Pneumococcal polysaccharide (PPSV23) vaccine. One dose is recommended after age 77. Talk to your health care provider about which screenings and vaccines you need and how often you need them. This information is not intended to replace advice given to you by your health care provider. Make sure you discuss any questions you have with your health care provider. Document Released: 06/25/2015 Document Revised: 02/16/2016 Document Reviewed: 03/30/2015 Elsevier Interactive Patient Education  2017 [employer].  Fall Prevention in the Home Falls can cause injuries. They can happen to people of all ages. There are many things you can do to make your home safe and to help prevent falls. What can I do on the outside of my home? Regularly fix the edges of walkways and driveways and fix any cracks. Remove anything that might make you trip as you walk through a door, such as a raised step or threshold. Trim any bushes or trees on the path to your home. Use bright outdoor lighting. Clear any walking paths of anything that might make someone trip, such as rocks or tools. Regularly check to see if handrails are loose or broken. Make sure that both sides of any steps have handrails. Any raised decks and porches should have guardrails on the edges. Have any leaves,  snow, or ice cleared regularly. Use sand or salt on walking paths during winter. Clean up any spills in your garage right away. This includes oil or grease spills. What can I do in the bathroom? Use night lights. Install grab bars by the toilet and in the tub and shower. Do not use towel bars as grab bars. Use non-skid mats or decals in the tub or shower. If you need to sit down in the shower, use a plastic, non-slip stool. Keep the floor dry. Clean up any water that spills on the floor as soon as it happens. Remove soap buildup in the tub or shower regularly. Attach bath mats securely with double-sided non-slip rug tape. Do not have throw rugs and other things on the floor that can make you trip. What can I do in the bedroom? Use night lights. Make sure that you have a light by your bed that is easy to reach. Do not use any sheets or blankets that are too big for your bed. They should not hang down onto the floor. Have a firm chair that has side arms. You can use this for support while you  get dressed. Do not have throw rugs and other things on the floor that can make you trip. What can I do in the kitchen? Clean up any spills right away. Avoid walking on wet floors. Keep items that you use a lot in easy-to-reach places. If you need to reach something above you, use a strong step stool that has a grab bar. Keep electrical cords out of the way. Do not use floor polish or wax that makes floors slippery. If you must use wax, use non-skid floor wax. Do not have throw rugs and other things on the floor that can make you trip. What can I do with my stairs? Do not leave any items on the stairs. Make sure that there are handrails on both sides of the stairs and use them. Fix handrails that are broken or loose. Make sure that handrails are as long as the stairways. Check any carpeting to make sure that it is firmly attached to the stairs. Fix any carpet that is loose or worn. Avoid having throw  rugs at the top or bottom of the stairs. If you do have throw rugs, attach them to the floor with carpet tape. Make sure that you have a light switch at the top of the stairs and the bottom of the stairs. If you do not have them, ask someone to add them for you. What else can I do to help prevent falls? Wear shoes that: Do not have high heels. Have rubber bottoms. Are comfortable and fit you well. Are closed at the toe. Do not wear sandals. If you use a stepladder: Make sure that it is fully opened. Do not climb a closed stepladder. Make sure that both sides of the stepladder are locked into place. Ask someone to hold it for you, if possible. Clearly mark and make sure that you can see: Any grab bars or handrails. First and last steps. Where the edge of each step is. Use tools that help you move around (mobility aids) if they are needed. These include: Canes. Walkers. Scooters. Crutches. Turn on the lights when you go into a dark area. Replace any light bulbs as soon as they burn out. Set up your furniture so you have a clear path. Avoid moving your furniture around. If any of your floors are uneven, fix them. If there are any pets around you, be aware of where they are. Review your medicines with your doctor. Some medicines can make you feel dizzy. This can increase your chance of falling. Ask your doctor what other things that you can do to help prevent falls. This information is not intended to replace advice given to you by your health care provider. Make sure you discuss any questions you have with your health care provider. Document Released: 03/25/2009 Document Revised: 11/04/2015 Document Reviewed: 07/03/2014 Elsevier Interactive Patient Education  2017 [employer].

## 2022-05-16 NOTE — Progress Notes (Signed)
Virtual Visit via Telephone Note  I connected with  Marcia Garcia on 05/16/22 at  1:30 PM EST by telephone and verified that I am speaking with the correct person using two identifiers.  Location: Patient: Home Provider: [place] Persons participating in the virtual visit: [name]   I discussed the limitations, risks, security and privacy concerns of performing an evaluation and management service by telephone and the availability of in person appointments. The patient expressed understanding and agreed to proceed.  Interactive audio and video telecommunications were attempted between this nurse and patient, however failed, due to patient having technical difficulties OR patient did not have access to video capability.  We continued and completed visit with audio only.  Some vital signs may be absent or patient reported.   [name], LPN  Subjective:   Marcia Garcia is a 66 y.o. female who presents for an Initial Medicare Annual Wellness Visit.  Review of Systems     Cardiac Risk Factors include: advanced age (>54mn, >>20women)     Objective:    Today's Vitals   05/16/22 1336  Height: 5' (1.524 m)  PainSc: 0-No pain   Body mass index is 29.69 kg/m.     05/16/2022    1:39 PM  Advanced Directives  Does Patient Have a Medical Advance Directive? No  Would patient like information on creating a medical advance directive? No - Patient declined    Current Medications (verified) Outpatient Encounter Medications as of 05/16/2022  Medication Sig   amLODipine (NORVASC) 5 MG tablet TAKE 1 TABLET DAILY   aspirin EC 81 MG tablet Take 1 tablet by mouth daily.   blood glucose meter kit and supplies KIT One touch glucometer.  Use daily and as needed to check sugars as directed.  (FOR E11.9).   Cholecalciferol (VITAMIN D) 50 MCG (2000 UT) CAPS Take by mouth.   docusate sodium (COLACE) 50 MG capsule Take 50 mg by mouth 2 (two) times  daily.   lisinopril (ZESTRIL) 20 MG tablet TAKE 1 TABLET DAILY   metFORMIN (GLUCOPHAGE-XR) 500 MG 24 hr tablet TAKE TWO TABLETS ONCE DAILY WITH BREAKFAST   Multiple Vitamin (MULTIVITAMIN) tablet Take 1 tablet by mouth daily.   rosuvastatin (CRESTOR) 10 MG tablet TAKE ONE TABLET BY MOUTH EVERY DAY   No facility-administered encounter medications on file as of 05/16/2022.    Allergies (verified) Atorvastatin   History: Past Medical History:  Diagnosis Date   Anemia    Diabetes mellitus without complication [place] [place]    Hyperlipidemia    Hypertension    Past Surgical History:  Procedure Laterality Date   PARTIAL HYSTERECTOMY     Family History  Problem Relation Age of Onset   Diabetes Mother    Diabetes Maternal Grandmother    Alzheimer's disease Maternal Grandmother    Colon cancer Neg Hx    Esophageal cancer Neg Hx    Rectal cancer Neg Hx    Stomach cancer Neg Hx    Hypercalcemia Neg Hx    Social History   Socioeconomic History   Marital status: Widowed    Spouse name: Not on file   Number of children: Not on file   Years of education: Not on file   Highest education level: Not on file  Occupational History   Not on file  Tobacco Use   Smoking status: Never   Smokeless tobacco: Never  Vaping Use   Vaping Use: Never used  Substance and Sexual Activity  Alcohol use: Never   Drug use: Not Currently   Sexual activity: Not Currently  Other Topics Concern   Not on file  Social History Narrative   Not on file   Social Determinants of Health   Financial Resource Strain: Low Risk  (05/16/2022)   Overall Financial Resource Strain (CARDIA)    Difficulty of Paying Living Expenses: Not hard at all  Food Insecurity: No Food Insecurity (05/16/2022)   Hunger Vital Sign    Worried About Running Out of Food in the Last Year: Never true    Ran Out of Food in the Last Year: Never true  Transportation Needs: No Transportation Needs (05/16/2022)   PRAPARE - Armed forces logistics/support/administrative officer (Medical): No    Lack of Transportation (Non-Medical): No  Physical Activity: Inactive (05/16/2022)   Exercise Vital Sign    Days of Exercise per Week: 0 days    Minutes of Exercise per Session: 0 min  Stress: No Stress Concern Present (05/16/2022)   [place]    Feeling of Stress : Not at all  Social Connections: Moderately Integrated (05/16/2022)   Social Connection and Isolation Panel [NHANES]    Frequency of Communication with Friends and Family: More than three times a week    Frequency of Social Gatherings with Friends and Family: More than three times a week    Attends Religious Services: More than 4 times per year    Active Member of [employer] or Organizations: Yes    Attends Archivist Meetings: More than 4 times per year    Marital Status: Widowed    Tobacco Counseling Counseling given: Not Answered   Clinical Intake:  Pre-visit preparation completed: Yes  Pain : No/denies pain Pain Score: 0-No pain     BMI - recorded: 29.69 (01/10/2022) Nutritional Status: BMI 25 -29 Overweight Nutritional Risks: None Diabetes: Yes CBG done?: No Did pt. bring in CBG monitor from home?: No  How often do you need to have someone help you when you read instructions, pamphlets, or other written materials from your doctor or pharmacy?: 1 - Never What is the last grade level you completed in school?: GED  Nutrition Risk Assessment:  Has the patient had any N/V/D within the last 2 months?  No  Does the patient have any non-healing wounds?  No  Has the patient had any unintentional weight loss or weight gain?  No   Diabetes:  Is the patient diabetic?  Yes  If diabetic, was a CBG obtained today?  No  Did the patient bring in their glucometer from home?  No  How often do you monitor your CBG's? As needed per patient.   Financial Strains and Diabetes Management:  Are you having any  financial strains with the device, your supplies or your medication? No .  Does the patient want to be seen by Chronic Care Management for management of their diabetes?  No  Would the patient like to be referred to a Nutritionist or for Diabetic Management?  No   Diabetic Exams:  Diabetic Eye Exam: Overdue for diabetic eye exam. Pt has been advised about the importance in completing this exam. Patient advised to call and schedule an eye exam. 07/14/2022.   Interpreter Needed?: No  Information entered by :: Lisette Abu, LPN.   Activities of Daily Living    05/16/2022    1:45 PM  In your present state of health, do you have  any difficulty performing the following activities:  Hearing? 0  Vision? 0  Difficulty concentrating or making decisions? 0  Walking or climbing stairs? 0  Dressing or bathing? 0  Doing errands, shopping? 0  Preparing Food and eating ? N  Using the Toilet? N  In the past six months, have you accidently leaked urine? N  Do you have problems with loss of bowel control? N  Managing your Medications? N  Managing your Finances? N  Housekeeping or managing your Housekeeping? N    Patient Care Team: Binnie Rail, MD as PCP - General (Internal Medicine) [facility] as Consulting Physician (Optometry)  Indicate any recent Medical Services you may have received from other than [facility] providers in the past year (date may be approximate).     Assessment:   This is a routine wellness examination for Marcia Garcia.  Hearing/Vision screen Hearing Screening - Comments:: Denies hearing difficulties   Vision Screening - Comments:: Lens implant done for right eye.  Left eye 20/40 with no correction. Patient up to date with routine eye exams with [facility].   Dietary issues and exercise activities discussed: Current Exercise Habits: The patient has a physically strenuous job, but has no regular exercise apart from work.   Goals Addressed             This  Visit's Progress    My goal is to keep my HgA1C under <7 and maintain my health by staying independent.       "I have a provider that cares for my health and that makes me care for my health."      Depression Screen    05/16/2022    1:44 PM 01/10/2022   11:13 AM 12/09/2020    9:35 AM 07/29/2019   10:44 AM  PHQ 2/9 Scores  PHQ - 2 Score 0 0 0 0  PHQ- 9 Score  0      Fall Risk    05/16/2022    1:40 PM 01/10/2022   11:13 AM 07/01/2021    9:33 AM 07/29/2019   10:43 AM  Fall Risk   Falls in the past year? 0 0 0 0  Number falls in past yr: 0 0 0 0  Injury with Fall? 0 0 0 0  Risk for fall due to : No Fall Risks No Fall Risks No Fall Risks   Follow up Falls prevention discussed Falls evaluation completed Falls evaluation completed     [place]:  Any stairs in or around the home? No  If so, are there any without handrails? No  Home free of loose throw rugs in walkways, pet beds, electrical cords, etc? Yes  Adequate lighting in your home to reduce risk of falls? Yes   ASSISTIVE DEVICES UTILIZED TO PREVENT FALLS:  Life alert? Yes  Use of a cane, walker or w/c? No  Grab bars in the bathroom? Yes  Shower chair or bench in shower? No  Elevated toilet seat or a handicapped toilet? No   TIMED UP AND GO:  Was the test performed? No . Phone Visit  Cognitive Function:        05/16/2022    1:59 PM  6CIT Screen  What Year? 0 points  What month? 0 points  What time? 0 points  Count back from 20 0 points  Months in reverse 0 points  Repeat phrase 0 points  Total Score 0 points    Immunizations Immunization History  Administered Date(s) Administered  Moderna Sars-Covid-2 Vaccination 08/08/2019, 09/05/2019, 05/14/2020, 10/29/2020   PPD Test 12/04/2019   Tdap 07/29/2012    TDAP status: Up to date  Flu Vaccine status: Declined, Education has been provided regarding the importance of this vaccine but patient still declined. Advised may  receive this vaccine at local pharmacy or Health Dept. Aware to provide a copy of the vaccination record if obtained from local pharmacy or Health Dept. Verbalized acceptance and understanding.  Pneumococcal vaccine status: Declined,  Education has been provided regarding the importance of this vaccine but patient still declined. Advised may receive this vaccine at local pharmacy or Health Dept. Aware to provide a copy of the vaccination record if obtained from local pharmacy or Health Dept. Verbalized acceptance and understanding.   Covid-19 vaccine status: Completed vaccines  Qualifies for Shingles Vaccine? Yes   Zostavax completed No   Shingrix Completed?: No.    Education has been provided regarding the importance of this vaccine. Patient has been advised to call insurance company to determine out of pocket expense if they have not yet received this vaccine. Advised may also receive vaccine at local pharmacy or Health Dept. Verbalized acceptance and understanding.  Screening Tests Health Maintenance  Topic Date Due   OPHTHALMOLOGY EXAM  Never done   Zoster Vaccines- Shingrix (1 of 2) Never done   Pneumonia Vaccine 52+ Years old (1 - PCV) Never done   FOOT EXAM  12/09/2021   COVID-19 Vaccine (5 - 2023-24 season) 02/10/2022   INFLUENZA VACCINE  09/10/2022 (Originally 01/10/2022)   Diabetic kidney evaluation - Urine ACR  07/01/2022   HEMOGLOBIN A1C  07/13/2022   DTaP/Tdap/Td (2 - Td or Tdap) 07/29/2022   Diabetic kidney evaluation - GFR measurement  01/11/2023   MAMMOGRAM  01/27/2023   Medicare Annual Wellness (AWV)  05/17/2023   DEXA SCAN  03/28/2024   COLONOSCOPY (Pts 45-24yr Insurance coverage will need to be confirmed)  04/15/2028   HPV VACCINES  Aged Out   Hepatitis C Screening  Discontinued   HIV Screening  Discontinued    Health Maintenance  Health Maintenance Due  Topic Date Due   OPHTHALMOLOGY EXAM  Never done   Zoster Vaccines- Shingrix (1 of 2) Never done   Pneumonia  Vaccine 66 Years old (1 - PCV) Never done   FOOT EXAM  12/09/2021   COVID-19 Vaccine (5 - 2023-24 season) 02/10/2022    Colorectal cancer screening: Type of screening: Colonoscopy. Completed 04/15/2018. Repeat every 10 years  Mammogram status: [facility] Repeat every year  Bone Density status: Completed 03/28/2021. Results reflect: Bone density results: OSTEOPENIA. Repeat every 2-3 years.  Lung Cancer Screening: (Low Dose CT Chest recommended if Age 66-80years, 30 pack-year currently smoking OR have quit w/in 15years.) does not qualify.   Lung Cancer Screening Referral: no  Additional Screening:  Hepatitis C Screening: does qualify; Completed no  Vision Screening: Recommended annual ophthalmology exams for early detection of glaucoma and other disorders of the eye. Is the patient up to date with their annual eye exam?  No  Who is the provider or what is the name of the office in which the patient attends annual eye exams? [facility] If pt is not established with a provider, would they like to be referred to a provider to establish care? No .   Dental Screening: Recommended annual dental exams for proper oral hygiene  Community Resource Referral / Chronic Care Management: CRR required this visit?  No   CCM required  this visit?  No      Plan:     I have personally reviewed and noted the following in the patient's chart:   Medical and social history Use of alcohol, tobacco or illicit drugs  Current medications and supplements including opioid prescriptions. Patient is not currently taking opioid prescriptions. Functional ability and status Nutritional status Physical activity Advanced directives List of other physicians Hospitalizations, surgeries, and ER visits in previous 12 months Vitals Screenings to include cognitive, depression, and falls Referrals and appointments  In addition, I have reviewed and discussed with patient  certain preventive protocols, quality metrics, and best practice recommendations. A written personalized care plan for preventive services as well as general preventive health recommendations were provided to patient.     [name], LPN   51/12/15   Nurse Notes: N/A

## 2022-07-12 IMAGING — US US ABDOMEN COMPLETE
1 series · 13 of 25 positions shown · non-contrast
Comparison: None.

CLINICAL DATA: Upper abdominal pain

EXAM:
ABDOMEN ULTRASOUND COMPLETE

[Series 1: us abdomen complete · 0.19mm/px · 13 of 90 slices shown]
[im 1/90]
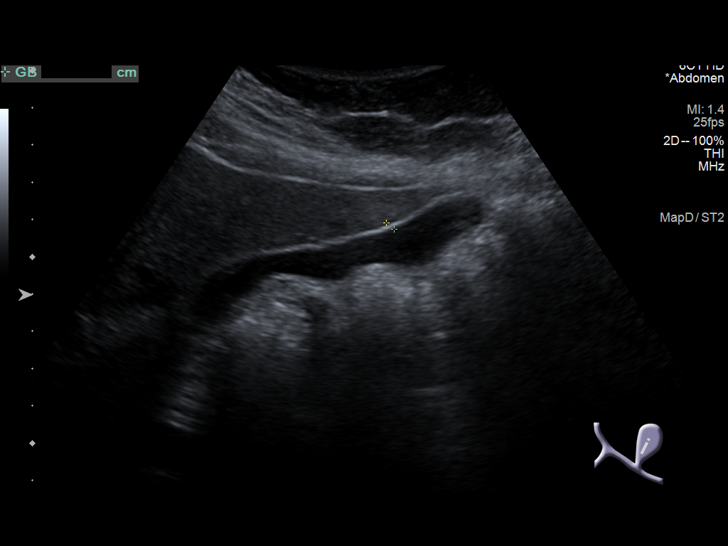
[im 8/90]
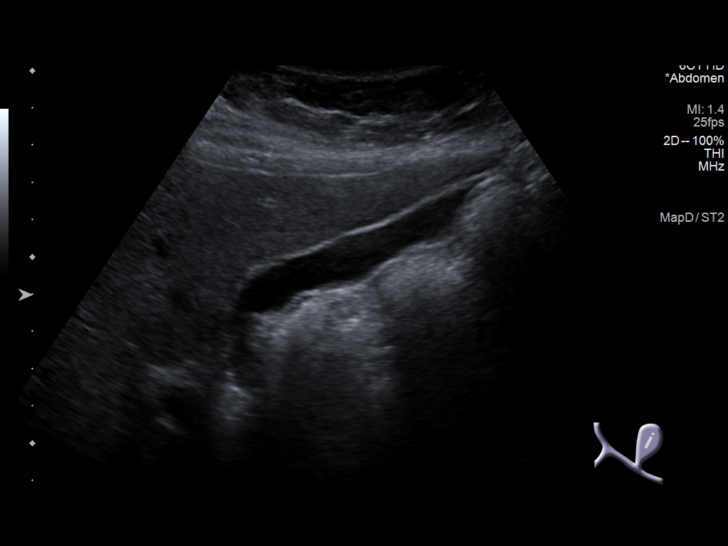
[im 15/90]
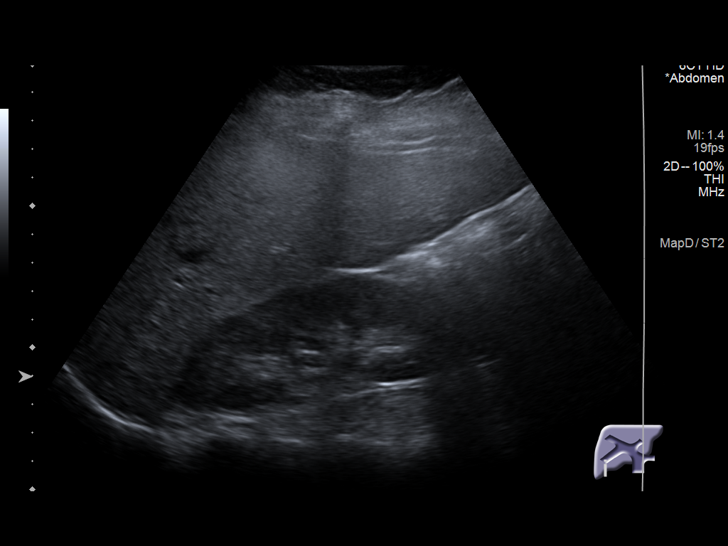
[im 23/90]
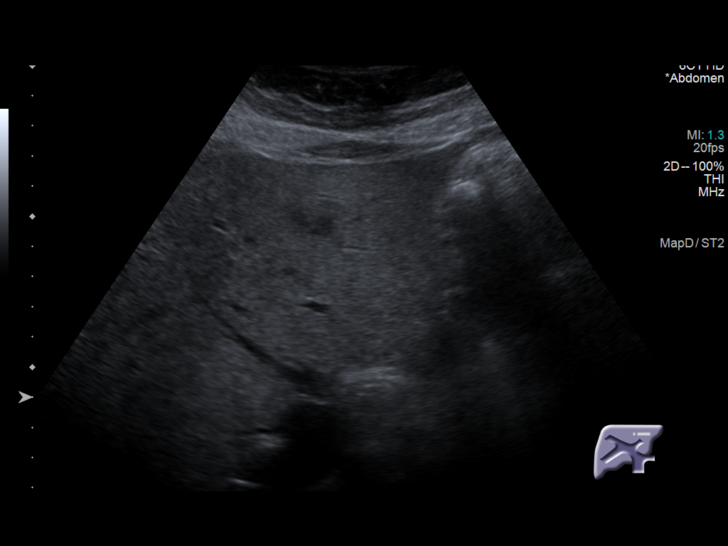
[im 30/90]
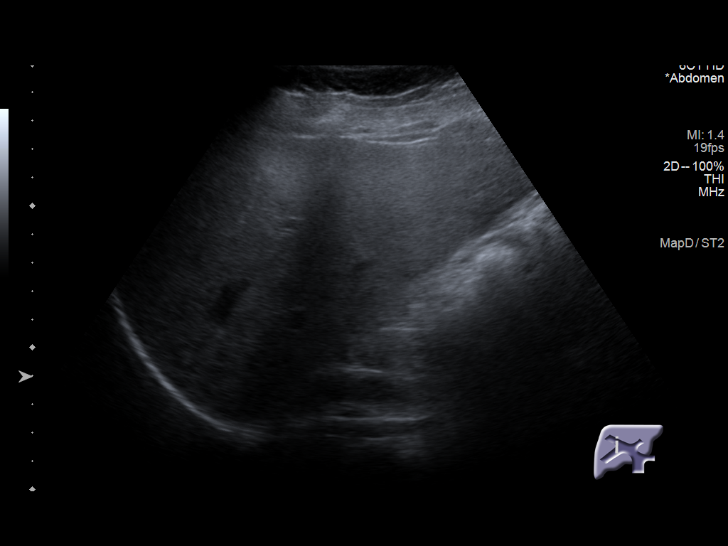
[im 38/90]
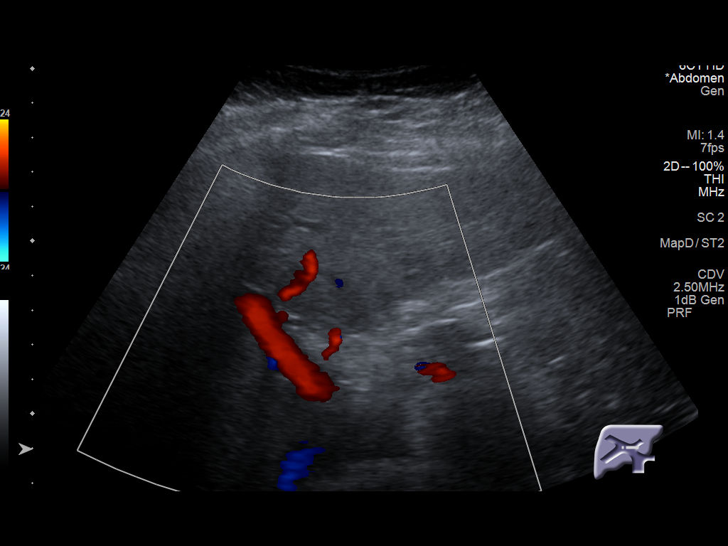
[im 45/90]
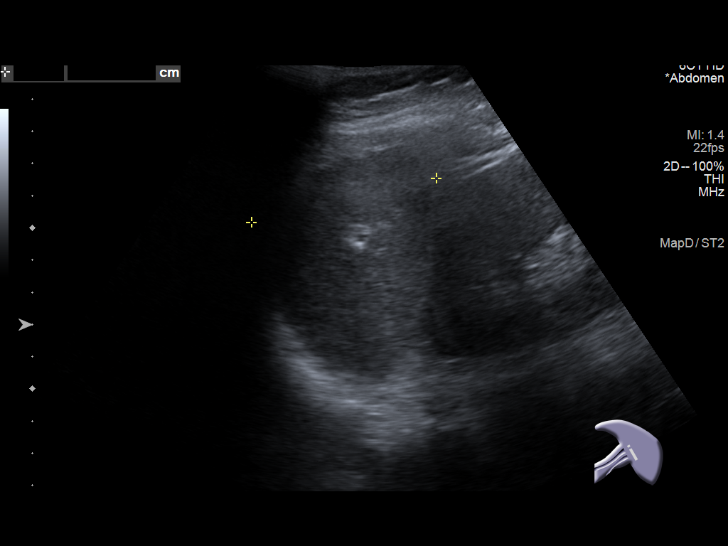
[im 52/90]
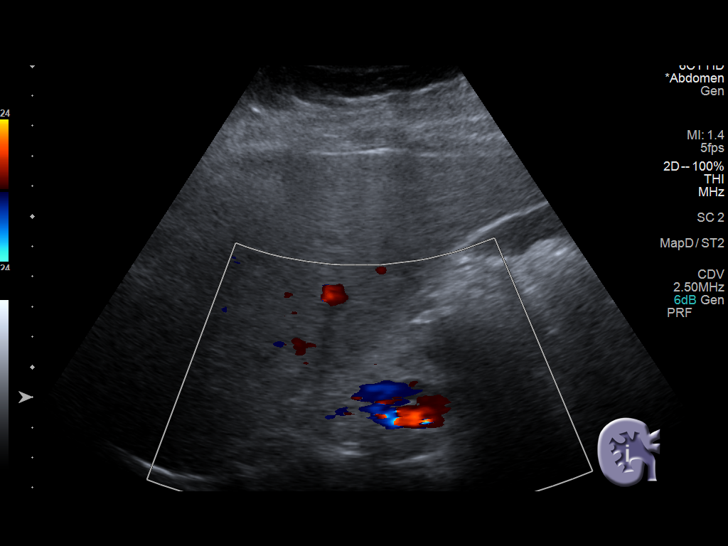
[im 60/90]
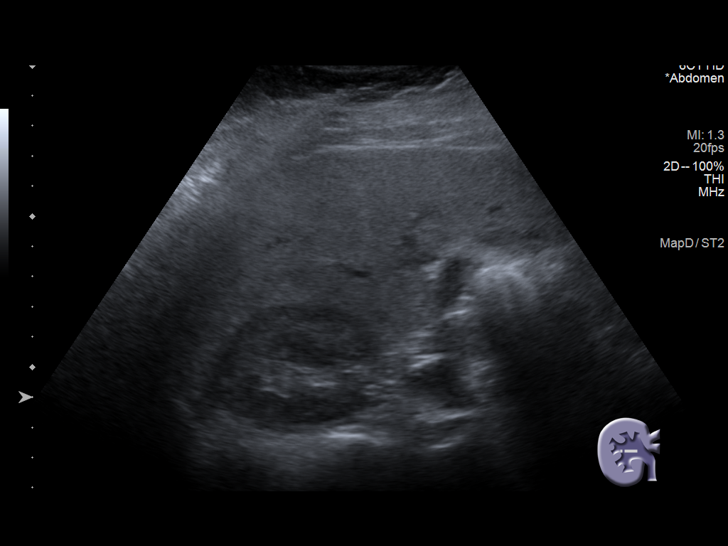
[im 67/90]
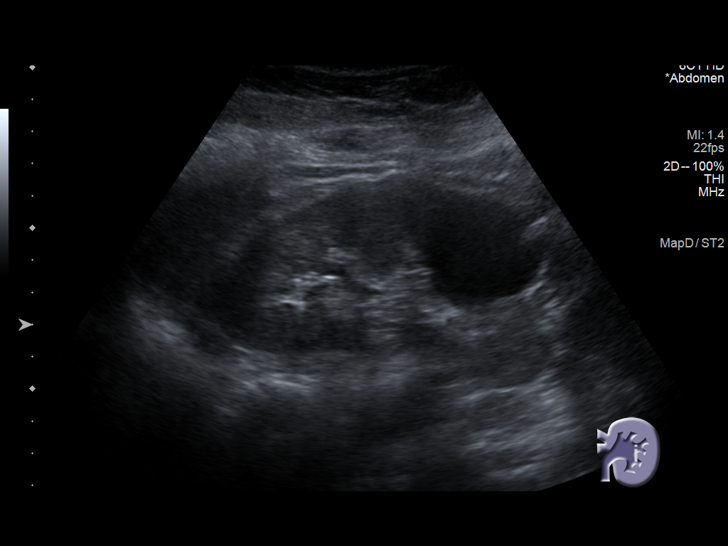
[im 75/90]
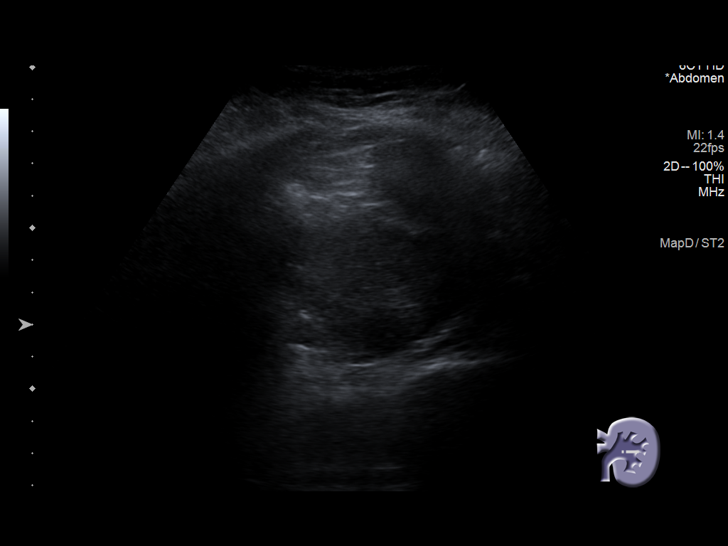
[im 82/90]
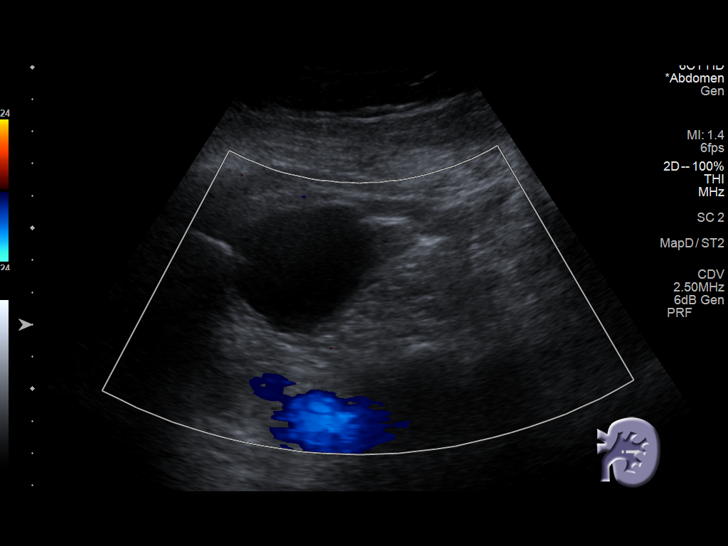
[im 90/90]
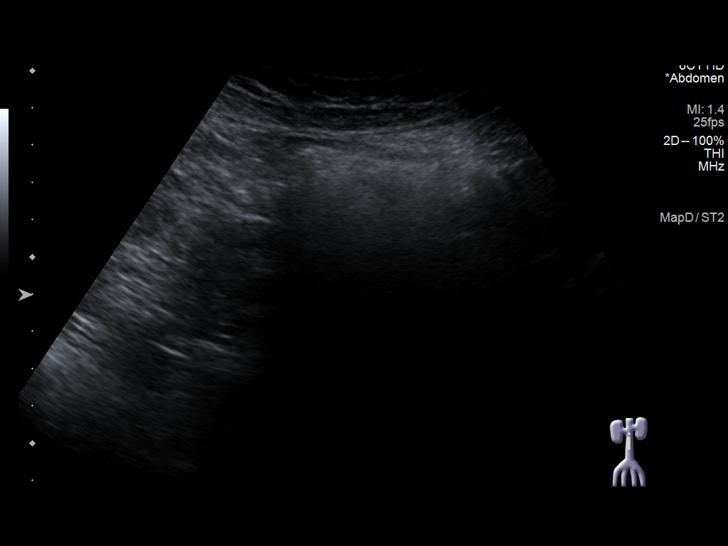

[13 of 25 positions shown; findings below may reference images not displayed]

FINDINGS: Gallbladder: No gallstones or wall thickening visualized. There is
no pericholecystic fluid. No sonographic Murphy sign noted by
sonographer.

Common bile duct: Diameter: 3 mm. No intrahepatic, common hepatic,
or common bile duct dilatation.

Liver: No focal lesion identified. Liver echogenicity overall is
increased. Portal vein is patent on color Doppler imaging with
normal direction of blood flow towards the liver.

IVC: No abnormality visualized.

Pancreas: No pancreatic mass or inflammatory focus.

Spleen: Size and appearance within normal limits.

Right Kidney: Length: 12.4 cm. Echogenicity within normal limits. No
mass or hydronephrosis visualized.

Left Kidney: Length: 11.1 cm. Echogenicity within normal limits. No
hydronephrosis visualized. There is a cyst arising from the lower
pole left kidney measuring 4.7 x 4.1 x 4.6 cm.

Abdominal aorta: No aneurysm visualized.

Other findings: No evident ascites.
IMPRESSION: 1. Increased liver echogenicity, a finding indicative of hepatic
steatosis. No focal liver lesions evident.

2. Cyst arising from lower pole left kidney measuring 4.7 x 4.1 x
4.6 cm.

3.  Study otherwise unremarkable.

## 2022-07-14 ENCOUNTER — Encounter: Payer: PPO | Admitting: Internal Medicine

## 2022-07-27 ENCOUNTER — Encounter: Payer: Self-pay | Admitting: Internal Medicine

## 2022-07-27 NOTE — Progress Notes (Signed)
Subjective:    Patient ID: Marcia Garcia, female    DOB: 05/18/1956, 67 y.o.   MRN: [id number]      HPI Everlena is here for a Physical exam and follow-up of her chronic medical problems.    Overall she is doing well and has no concerns.  Medications and allergies reviewed with patient and updated if appropriate.  Current Outpatient Medications on File Prior to Visit  Medication Sig Dispense Refill   amLODipine (NORVASC) 5 MG tablet TAKE 1 TABLET DAILY 90 tablet 3   aspirin EC 81 MG tablet Take 1 tablet by mouth daily.     blood glucose meter kit and supplies KIT One touch glucometer.  Use daily and as needed to check sugars as directed.  (FOR E11.9). 1 each 0   Cholecalciferol (VITAMIN D) 50 MCG (2000 UT) CAPS Take by mouth.     docusate sodium (COLACE) 50 MG capsule Take 50 mg by mouth 2 (two) times daily.     lisinopril (ZESTRIL) 20 MG tablet TAKE 1 TABLET DAILY 90 tablet 3   metFORMIN (GLUCOPHAGE-XR) 500 MG 24 hr tablet TAKE TWO TABLETS ONCE DAILY WITH BREAKFAST 180 tablet 1   Multiple Vitamin (MULTIVITAMIN) tablet Take 1 tablet by mouth daily.     rosuvastatin (CRESTOR) 10 MG tablet TAKE ONE TABLET BY MOUTH EVERY DAY 90 tablet 3   No current facility-administered medications on file prior to visit.    Review of Systems  Constitutional:  Negative for fever.  Eyes:  Negative for visual disturbance.  Respiratory:  Negative for cough, shortness of breath and wheezing.   Cardiovascular:  Negative for chest pain, palpitations and leg swelling.  Gastrointestinal:  Negative for abdominal pain, blood in stool, constipation and diarrhea.       No gerd  Genitourinary:  Negative for dysuria.  Musculoskeletal:  Negative for arthralgias and back pain.  Skin:  Negative for rash.  Neurological:  Negative for light-headedness and headaches.  Psychiatric/Behavioral:  Negative for dysphoric mood. The patient is not nervous/anxious.        Objective:   Vitals:   07/28/22 0953   BP: 126/80  Pulse: 67  Temp: 98.4 F (36.9 C)  SpO2: 97%   Filed Weights   07/28/22 0953  Weight: 152 lb (68.9 kg)   Body mass index is 29.69 kg/m.  BP Readings from Last 3 Encounters:  07/28/22 126/80  01/10/22 120/80  07/01/21 126/70    Wt Readings from Last 3 Encounters:  07/28/22 152 lb (68.9 kg)  01/10/22 152 lb (68.9 kg)  07/01/21 152 lb (68.9 kg)       Physical Exam Constitutional: She appears well-developed and well-nourished. No distress.  HENT:  Head: Normocephalic and atraumatic.  Right Ear: External ear normal. Normal ear canal and TM Left Ear: External ear normal.  Normal ear canal and TM Mouth/Throat: Oropharynx is clear and moist.  Eyes: Conjunctivae normal.  Neck: Neck supple. No tracheal deviation present. No thyromegaly present.  No carotid bruit  Cardiovascular: Normal rate, regular rhythm and normal heart sounds.   No murmur heard.  No edema. Pulmonary/Chest: Effort normal and breath sounds normal. No respiratory distress. She has no wheezes. She has no rales.  Breast: deferred   Abdominal: Soft. She exhibits no distension. There is no tenderness.  Lymphadenopathy: She has no cervical adenopathy.  Skin: Skin is warm and dry. She is not diaphoretic.  Psychiatric: She has a normal mood and affect. Her behavior is normal.  Lab Results  Component Value Date   WBC 6.1 01/10/2022   HGB 11.1 (L) 01/10/2022   HCT 35.8 (L) 01/10/2022   PLT 343.0 01/10/2022   GLUCOSE 96 01/10/2022   CHOL 148 01/10/2022   TRIG 93.0 01/10/2022   HDL 55.40 01/10/2022   LDLCALC 74 01/10/2022   ALT 20 01/10/2022   AST 19 01/10/2022   NA 138 01/10/2022   K 5.1 01/10/2022   CL 105 01/10/2022   CREATININE 0.72 01/10/2022   BUN 12 01/10/2022   CO2 28 01/10/2022   TSH 3.35 12/09/2020   HGBA1C 6.2 01/10/2022   MICROALBUR 1.2 07/01/2021         Assessment & Plan:   Physical exam: Screening blood work  ordered Exercise  very active at work Weight    ok Substance abuse  none   Reviewed recommended immunizations.   Will get shingles.  Will get prevnar in august  Health Maintenance  Topic Date Due   OPHTHALMOLOGY EXAM  Never done   Zoster Vaccines- Shingrix (1 of 2) Never done   Pneumonia Vaccine 50+ Years old (1 of 1 - PCV) Never done   FOOT EXAM  12/09/2021   Diabetic kidney evaluation - Urine ACR  07/01/2022   HEMOGLOBIN A1C  07/13/2022   COVID-19 Vaccine (5 - 2023-24 season) 08/13/2022 (Originally 02/10/2022)   INFLUENZA VACCINE  09/10/2022 (Originally 01/10/2022)   DTaP/Tdap/Td (2 - Td or Tdap) 07/29/2022   Diabetic kidney evaluation - eGFR measurement  01/11/2023   MAMMOGRAM  01/27/2023   Medicare Annual Wellness (AWV)  05/17/2023   DEXA SCAN  03/28/2024   COLONOSCOPY (Pts 45-20yr Insurance coverage will need to be confirmed)  04/15/2028   HPV VACCINES  Aged Out   Hepatitis C Screening  Discontinued          See Problem List for Assessment and Plan of chronic medical problems.

## 2022-07-27 NOTE — Patient Instructions (Addendum)
[facility] [place], [place] [address] [phone]  [facility]  [address] [place], [place], [place] [address] Phone: [phone]  [facility] [place], [place], [place] [address] Phone: [phone]  [facility] [place], [place], [place] [address] [phone]    Blood work was ordered.   The lab is on the first floor.    Medications changes include :   none      Return in about 6 months (around 01/26/2023) for follow up.   Health Maintenance, Female Adopting a healthy lifestyle and getting preventive care are important in promoting health and wellness. Ask your health care provider about: The right schedule for you to have regular tests and exams. Things you can do on your own to prevent diseases and keep yourself healthy. What should I know about diet, weight, and exercise? Eat a healthy diet  Eat a diet that includes plenty of vegetables, fruits, low-fat dairy products, and lean protein. Do not eat a lot of foods that are high in solid fats, added sugars, or sodium. Maintain a healthy weight Body mass index (BMI) is used to identify weight problems. It estimates body fat based on height and weight. Your health care provider can help determine your BMI and help you achieve or maintain a healthy weight. Get regular exercise Get regular exercise. This is one of the most important things you can do for your health. Most adults should: Exercise for at least 150 minutes each week. The exercise should increase your heart rate and make you sweat (moderate-intensity exercise). Do strengthening exercises at least twice a week. This is in addition to the moderate-intensity exercise. Spend less time sitting. Even light physical activity can be beneficial. Watch cholesterol and blood lipids Have your blood tested for lipids and cholesterol at 67 years of age, then have this test every 5 years. Have your  cholesterol levels checked more often if: Your lipid or cholesterol levels are high. You are older than 67 years of age. You are at high risk for heart disease. What should I know about cancer screening? Depending on your health history and family history, you may need to have cancer screening at various ages. This may include screening for: Breast cancer. Cervical cancer. Colorectal cancer. Skin cancer. Lung cancer. What should I know about heart disease, diabetes, and high blood pressure? Blood pressure and heart disease High blood pressure causes heart disease and increases the risk of stroke. This is more likely to develop in people who have high blood pressure readings or are overweight. Have your blood pressure checked: Every 3-5 years if you are 78-16 years of age. Every year if you are 47 years old or older. Diabetes Have regular diabetes screenings. This checks your fasting blood sugar level. Have the screening done: Once every three years after age 74 if you are at a normal weight and have a low risk for diabetes. More often and at a younger age if you are overweight or have a high risk for diabetes. What should I know about preventing infection? Hepatitis B If you have a higher risk for hepatitis B, you should be screened for this virus. Talk with your health care provider to find out if you are at risk for hepatitis B infection. Hepatitis C Testing is recommended for: Everyone born from [birth year] through 1965. Anyone with known risk factors for hepatitis C. Sexually transmitted infections (STIs) Get screened for STIs, including  gonorrhea and chlamydia, if: You are sexually active and are younger than 67 years of age. You are older than 67 years of age and your health care provider tells you that you are at risk for this type of infection. Your sexual activity has changed since you were last screened, and you are at increased risk for chlamydia or gonorrhea. Ask your health care  provider if you are at risk. Ask your health care provider about whether you are at high risk for HIV. Your health care provider may recommend a prescription medicine to help prevent HIV infection. If you choose to take medicine to prevent HIV, you should first get tested for HIV. You should then be tested every 3 months for as long as you are taking the medicine. Pregnancy If you are about to stop having your period (premenopausal) and you may become pregnant, seek counseling before you get pregnant. Take 400 to 800 micrograms (mcg) of folic acid every day if you become pregnant. Ask for birth control (contraception) if you want to prevent pregnancy. Osteoporosis and menopause Osteoporosis is a disease in which the bones lose minerals and strength with aging. This can result in bone fractures. If you are 61 years old or older, or if you are at risk for osteoporosis and fractures, ask your health care provider if you should: Be screened for bone loss. Take a calcium or vitamin D supplement to lower your risk of fractures. Be given hormone replacement therapy (HRT) to treat symptoms of menopause. Follow these instructions at home: Alcohol use Do not drink alcohol if: Your health care provider tells you not to drink. You are pregnant, may be pregnant, or are planning to become pregnant. If you drink alcohol: Limit how much you have to: 0-1 drink a day. Know how much alcohol is in your drink. In the [place] one drink equals one 12 oz bottle of beer (355 mL), one 5 oz glass of wine (148 mL), or one 1 oz glass of hard liquor (44 mL). Lifestyle Do not use any products that contain nicotine or tobacco. These products include cigarettes, chewing tobacco, and vaping devices, such as e-cigarettes. If you need help quitting, ask your health care provider. Do not use street drugs. Do not share needles. Ask your health care provider for help if you need support or information about quitting drugs. General  instructions Schedule regular health, dental, and eye exams. Stay current with your vaccines. Tell your health care provider if: You often feel depressed. You have ever been abused or do not feel safe at home. Summary Adopting a healthy lifestyle and getting preventive care are important in promoting health and wellness. Follow your health care provider's instructions about healthy diet, exercising, and getting tested or screened for diseases. Follow your health care provider's instructions on monitoring your cholesterol and blood pressure. This information is not intended to replace advice given to you by your health care provider. Make sure you discuss any questions you have with your health care provider. Document Revised: 10/18/2020 Document Reviewed: 10/18/2020 Elsevier Patient Education  [place].

## 2022-07-28 ENCOUNTER — Ambulatory Visit (INDEPENDENT_AMBULATORY_CARE_PROVIDER_SITE_OTHER): Payer: PPO | Admitting: Internal Medicine

## 2022-07-28 VITALS — BP 126/80 | HR 67 | Temp 98.4°F | Ht 60.0 in | Wt 152.0 lb

## 2022-07-28 DIAGNOSIS — I10 Essential (primary) hypertension: Secondary | ICD-10-CM

## 2022-07-28 DIAGNOSIS — M85851 Other specified disorders of bone density and structure, right thigh: Secondary | ICD-10-CM | POA: Diagnosis not present

## 2022-07-28 DIAGNOSIS — Z0000 Encounter for general adult medical examination without abnormal findings: Secondary | ICD-10-CM

## 2022-07-28 DIAGNOSIS — M85852 Other specified disorders of bone density and structure, left thigh: Secondary | ICD-10-CM | POA: Diagnosis not present

## 2022-07-28 DIAGNOSIS — E210 Primary hyperparathyroidism: Secondary | ICD-10-CM | POA: Diagnosis not present

## 2022-07-28 DIAGNOSIS — E782 Mixed hyperlipidemia: Secondary | ICD-10-CM | POA: Diagnosis not present

## 2022-07-28 DIAGNOSIS — E119 Type 2 diabetes mellitus without complications: Secondary | ICD-10-CM

## 2022-07-28 LAB — LIPID PANEL
Cholesterol: 126 mg/dL (ref 0–200)
HDL: 46.4 mg/dL (ref >39.00)
LDL Cholesterol: 66 mg/dL (ref 0–99)
NonHDL: 79.75
Total CHOL/HDL Ratio: 3
Triglycerides: 71 mg/dL (ref 0.0–149.0)
VLDL: 14.2 mg/dL (ref 0.0–40.0)

## 2022-07-28 LAB — CBC WITH DIFFERENTIAL/PLATELET
Basophils Absolute: 0 10*3/uL (ref 0.0–0.1)
Basophils Relative: 0.3 % (ref 0.0–3.0)
Eosinophils Absolute: 0 10*3/uL (ref 0.0–0.7)
Eosinophils Relative: 0.5 % (ref 0.0–5.0)
HCT: 33.8 % — ABNORMAL LOW (ref 36.0–46.0)
Hemoglobin: 10.7 g/dL — ABNORMAL LOW (ref 12.0–15.0)
Lymphocytes Relative: 27.6 % (ref 12.0–46.0)
Lymphs Abs: 1.6 10*3/uL (ref 0.7–4.0)
MCHC: 31.6 g/dL (ref 30.0–36.0)
MCV: 68.9 fl — ABNORMAL LOW (ref 78.0–100.0)
Monocytes Absolute: 0.7 10*3/uL (ref 0.1–1.0)
Monocytes Relative: 11.8 % (ref 3.0–12.0)
Neutro Abs: 3.4 10*3/uL (ref 1.4–7.7)
Neutrophils Relative %: 59.8 % (ref 43.0–77.0)
Platelets: 362 10*3/uL (ref 150.0–400.0)
RBC: 4.91 Mil/uL (ref 3.87–5.11)
RDW: 17.4 % — ABNORMAL HIGH (ref 11.5–15.5)
WBC: 5.6 10*3/uL (ref 4.0–10.5)

## 2022-07-28 LAB — COMPREHENSIVE METABOLIC PANEL
ALT: 23 U/L (ref 0–35)
AST: 24 U/L (ref 0–37)
Albumin: 4.6 g/dL (ref 3.5–5.2)
Alkaline Phosphatase: 63 U/L (ref 39–117)
BUN: 13 mg/dL (ref 6–23)
CO2: 30 mEq/L (ref 19–32)
Calcium: 10.5 mg/dL (ref 8.4–10.5)
Chloride: 103 mEq/L (ref 96–112)
Creatinine, Ser: 0.72 mg/dL (ref 0.40–1.20)
GFR: 87.28 mL/min (ref >60.00)
Glucose, Bld: 108 mg/dL — ABNORMAL HIGH (ref 70–99)
Potassium: 4.2 mEq/L (ref 3.5–5.1)
Sodium: 139 mEq/L (ref 135–145)
Total Bilirubin: 0.7 mg/dL (ref 0.2–1.2)
Total Protein: 8.4 g/dL — ABNORMAL HIGH (ref 6.0–8.3)

## 2022-07-28 LAB — MICROALBUMIN / CREATININE URINE RATIO
Creatinine,U: 203.4 mg/dL
Microalb Creat Ratio: 1.6 mg/g (ref 0.0–30.0)
Microalb, Ur: 3.3 mg/dL — ABNORMAL HIGH (ref 0.0–1.9)

## 2022-07-28 LAB — TSH: TSH: 2.73 u[IU]/mL (ref 0.35–5.50)

## 2022-07-28 LAB — HEMOGLOBIN A1C: Hgb A1c MFr Bld: 6.9 % — ABNORMAL HIGH (ref 4.6–6.5)

## 2022-07-28 NOTE — Assessment & Plan Note (Signed)
Chronic   Lab Results  Component Value Date   HGBA1C 6.2 01/10/2022   Sugars well controlled Check A1c, Continue metformin XR 1000 mg daily with breakfast Stressed regular exercise, diabetic diet

## 2022-07-28 NOTE — Assessment & Plan Note (Signed)
Chronic Not currently following with endocrine Will check CMP, PTH DEXA up-to-date Per endocrine the plan was to monitor since bone density was good

## 2022-07-28 NOTE — Assessment & Plan Note (Signed)
Chronic Blood pressure well controlled CMP Continue amlodipine 5 mg daily, lisinopril 20 mg daily EKG today: Normal sinus rhythm 72 bpm, nonspecific T wave abnormality.  No prior EKG for comparison

## 2022-07-28 NOTE — Assessment & Plan Note (Signed)
Chronic DEXA up-to-date She is very active and does a lot of walking at work Continue vitamin D daily, multivitamin

## 2022-07-28 NOTE — Assessment & Plan Note (Signed)
Chronic Regular exercise and healthy diet encouraged Check lipid panel  Continue Crestor 10 mg daily 

## 2022-07-30 LAB — PTH, INTACT AND CALCIUM
Calcium: 10.4 mg/dL (ref 8.6–10.4)
PTH: 98 pg/mL — ABNORMAL HIGH (ref 16–77)

## 2022-10-24 ENCOUNTER — Ambulatory Visit: Payer: PPO | Admitting: Internal Medicine

## 2022-10-24 ENCOUNTER — Encounter: Payer: Self-pay | Admitting: Internal Medicine

## 2022-10-24 VITALS — BP 134/70 | HR 72 | Ht 60.0 in | Wt 148.2 lb

## 2022-10-24 DIAGNOSIS — E210 Primary hyperparathyroidism: Secondary | ICD-10-CM | POA: Diagnosis not present

## 2022-10-24 DIAGNOSIS — E010 Iodine-deficiency related diffuse (endemic) goiter: Secondary | ICD-10-CM | POA: Diagnosis not present

## 2022-10-24 NOTE — Patient Instructions (Addendum)
Stay Hydrated  Avoid over the counter calcium tablets  Maintain 2-3 servings of dietary calcium daily  Continue Vitamin D 2000 iu daily      24-Hour Urine Collection ( Wait at least a month after stopping calcium tablets)  You will be collecting your urine for a 24-hour period of time. Your timer starts with your first urine of the morning (For example - If you first pee at 9AM, your timer will start at 9AM) Throw away your first urine of the morning Collect your urine every time you pee for the next 24 hours STOP your urine collection 24 hours after you started the collection (For example - You would stop at 9AM the day after you started)

## 2022-10-24 NOTE — Progress Notes (Signed)
Name: Marcia Garcia  MRN/ DOB: [id number], [birth year]    Age/ Sex: 67 y.o., female     PCP: Pincus Sanes, MD   Reason for Endocrinology Evaluation: Hyperparathyroidism/ Hypercalcemia      Initial Endocrinology Clinic Visit: 03/21/2021    PATIENT IDENTIFIER: Marcia Garcia is a 67 y.o., female with a past medical history of HTN. She has followed with [facility] since 03/21/2021 for consultative assistance with management of her Hypercalcemia .   HISTORICAL SUMMARY: The patient was first diagnosed with hypercalcemia in 2021 , with a max serum calcium of 11.4 mg/DL 09/979 ( 19.14 corrected), with elevated PTH with a max 113 PG/mL 11/2020  DXA - Osteopenia 03/28/2021  She was seen by Dr. Everardo All 03/2021  SUBJECTIVE:    Today (10/24/2022):  Ms. Cann is here for a follow-up on hypercalcemia    She stays hydrated  Denies polydipsia , nor polyuria  No constipation - she is on stool softeners  No recent falls  No hx of bone fractures   She is on MVI  Vitamin D 2000 iu daily   HISTORY:  Past Medical History:  Past Medical History:  Diagnosis Date   Anemia    Diabetes mellitus without complication (HCC)    Hyperlipidemia    Hypertension    Past Surgical History:  Past Surgical History:  Procedure Laterality Date   PARTIAL HYSTERECTOMY     Social History:  reports that she has never smoked. She has never used smokeless tobacco. She reports that she does not currently use drugs. She reports that she does not drink alcohol. Family History:  Family History  Problem Relation Age of Onset   Diabetes Mother    Diabetes Maternal Grandmother    Alzheimer's disease Maternal Grandmother    Colon cancer Neg Hx    Esophageal cancer Neg Hx    Rectal cancer Neg Hx    Stomach cancer Neg Hx    Hypercalcemia Neg Hx      HOME MEDICATIONS: Allergies as of 10/24/2022       Reactions   Atorvastatin Hives        Medication List        Accurate  as of Oct 24, 2022 11:39 AM. If you have any questions, ask your nurse or doctor.          amLODipine 5 MG tablet Commonly known as: NORVASC TAKE 1 TABLET DAILY   aspirin EC 81 MG tablet Take 1 tablet by mouth daily.   blood glucose meter kit and supplies Kit One touch glucometer.  Use daily and as needed to check sugars as directed.  (FOR E11.9).   docusate sodium 50 MG capsule Commonly known as: COLACE Take 50 mg by mouth 2 (two) times daily.   lisinopril 20 MG tablet Commonly known as: ZESTRIL TAKE 1 TABLET DAILY   metFORMIN 500 MG 24 hr tablet Commonly known as: GLUCOPHAGE-XR TAKE TWO TABLETS ONCE DAILY WITH BREAKFAST   multivitamin tablet Take 1 tablet by mouth daily.   rosuvastatin 10 MG tablet Commonly known as: CRESTOR TAKE ONE TABLET BY MOUTH EVERY DAY   Vitamin D 50 MCG (2000 UT) Caps Take by mouth.          OBJECTIVE:   PHYSICAL EXAM: VS: BP 134/70 (BP Location: Left Arm, Patient Position: Sitting, Cuff Size: Large)   Pulse 72   Ht 5' (1.524 m)   Wt 148 lb 3.2 oz (67.2 kg)   SpO2 97%  BMI 28.94 kg/m    EXAM: General: Pt appears well and is in NAD  Eyes: External eye exam normal without stare, lid lag or exophthalmos.  EOM intact.    Neck: General: Supple without adenopathy. Thyroid: Thyroid size normal.  No goiter or nodules appreciated. No thyroid bruit.  Lungs: Clear with good BS bilat   Heart: Auscultation: RRR.  Abdomen: soft, nontender  Extremities:  BL LE: No pretibial edema   Mental Status: Judgment, insight: Intact Orientation: Oriented to time, place, and person Mood and affect: No depression, anxiety, or agitation     DATA REVIEWED:   Latest Reference Range & Units 07/28/22 10:40  Sodium 135 - 145 mEq/L 139  Potassium 3.5 - 5.1 mEq/L 4.2  Chloride 96 - 112 mEq/L 103  CO2 19 - 32 mEq/L 30  Glucose 70 - 99 mg/dL 401 (H)  BUN 6 - 23 mg/dL 13  Creatinine 0.27 - 2.53 mg/dL 6.64  Calcium 8.6 - 40.3 mg/dL 8.4 - 47.4  mg/dL 25.9 56.3  Alkaline Phosphatase 39 - 117 U/L 63  Albumin 3.5 - 5.2 g/dL 4.6  AST 0 - 37 U/L 24  ALT 0 - 35 U/L 23  Total Protein 6.0 - 8.3 g/dL 8.4 (H)  Total Bilirubin 0.2 - 1.2 mg/dL 0.7  GFR >87.56 mL/min 87.28    Latest Reference Range & Units 07/28/22 10:40  PTH, Intact 16 - 77 pg/mL 98 (H)  (H): Data is abnormally high   Old records , labs and images have been reviewed.   ASSESSMENT / PLAN / RECOMMENDATIONS:   Primary Hyperparathyroidism:   -We discussed pathophysiology of primary hyperparathyroidism -Based on available data, patient does not meet surgical criteria for parathyroidectomy -I would like to proceed with 24-hour urinary calcium excretion -Patient advised to stop OTC calcium tablets and to consume 2-3 servings of dietary calcium daily -I have reviewed her most recent labs from February 2024, with normal GFR, serum calcium and elevated PTH  Recommendations  Stay hydrated Avoid OTC calcium tablets Maintain 2-3 servings of dietary calcium daily Continue vitamin D 2000 IU daily  Follow-up in 6 months   I spent 25 minutes preparing to see the patient by review of recent labs, imaging and procedures, obtaining and reviewing separately obtained history, communicating with the patient/family or caregiver, ordering medications, tests or procedures, and documenting clinical information in the EHR including the differential Dx, treatment, and any further evaluation and other management   Signed electronically by: Lyndle Herrlich, MD  [facility]  [facility] [address] [place] [place], [place] [address] Phone: [phone] FAX: [phone]      CC: Pincus Sanes, MD [address] [place] [address] Phone: [phone]  Fax: [phone]   Return to Endocrinology clinic as below: Future Appointments  Date Time Provider Department Center  01/26/2023 10:00 AM Pincus Sanes, MD [id number] None

## 2022-10-31 ENCOUNTER — Ambulatory Visit
Admission: RE | Admit: 2022-10-31 | Discharge: 2022-10-31 | Disposition: A | Discharge status: Home / Self Care | Payer: PPO | Source: Ambulatory Visit | Attending: Internal Medicine | Admitting: Internal Medicine

## 2022-10-31 DIAGNOSIS — E010 Iodine-deficiency related diffuse (endemic) goiter: Secondary | ICD-10-CM

## 2022-10-31 DIAGNOSIS — R221 Localized swelling, mass and lump, neck: Secondary | ICD-10-CM | POA: Diagnosis not present

## 2022-11-02 ENCOUNTER — Encounter: Payer: Self-pay | Admitting: Internal Medicine

## 2022-11-07 ENCOUNTER — Telehealth: Payer: Self-pay

## 2022-11-07 NOTE — Telephone Encounter (Signed)
Patient given [place] results and want to know does she still need to find a vitamin without vitamin C or can she keep using the one with it.

## 2022-11-07 NOTE — Telephone Encounter (Signed)
Patient advised and will get a vitamin that doesn't have calcium

## 2022-11-07 NOTE — Telephone Encounter (Signed)
Pt is asking to speak with Dr. Lawerance Bach nurse as she was taken off some meds from her Endo doctor and she has some questions about that apptmnt as well as she was told she needed an [place] of her thyroid and has not heard anything about this. Pt states she just would like a call about her concerns.

## 2022-11-09 NOTE — Telephone Encounter (Signed)
Her calcium level was too high so for now she should not take anything calcium supplementation.  She should just increase her calcium through diet which her body can deal with better and it will not elevate her calcium level in her blood.  This will hopefully change in the future and she may be able to go back on supplementation.

## 2022-11-10 NOTE — Telephone Encounter (Signed)
Spoke with patient today. 

## 2022-11-25 ENCOUNTER — Other Ambulatory Visit: Payer: Self-pay | Admitting: Internal Medicine

## 2022-12-28 LAB — HM DIABETES EYE EXAM

## 2023-01-25 ENCOUNTER — Encounter: Payer: Self-pay | Admitting: Internal Medicine

## 2023-01-25 NOTE — Patient Instructions (Addendum)
    BP goal < 130/80   Blood work was ordered.   The lab is on the first floor.    Medications changes include :   stop lisinopril -- start valsartan 80 mg daily in its place     Return in about 6 months (around 07/29/2023) for Physical Exam.

## 2023-01-25 NOTE — Progress Notes (Signed)
Subjective:    Patient ID: Marcia Garcia, female    DOB: 12-05-1955, 67 y.o.   MRN: [id number]     HPI Marcia Garcia is here for follow up of her chronic medical problems.  Doing well.  She did see her eye doctor-has been experiencing blurry vision.  Eye doctor told her it was not an eye issue.  She is worried about being a side effect from the medication she is taking-she did look up lisinopril and found that it could potentially cause blurred vision and wonders about stopping it  Medications and allergies reviewed with patient and updated if appropriate.  Current Outpatient Medications on File Prior to Visit  Medication Sig Dispense Refill   amLODipine (NORVASC) 5 MG tablet TAKE 1 TABLET DAILY 90 tablet 3   aspirin EC 81 MG tablet Take 1 tablet by mouth daily.     blood glucose meter kit and supplies KIT One touch glucometer.  Use daily and as needed to check sugars as directed.  (FOR E11.9). 1 each 0   Cholecalciferol (VITAMIN D) 50 MCG (2000 UT) CAPS Take by mouth.     docusate sodium (COLACE) 50 MG capsule Take 50 mg by mouth 2 (two) times daily.     lisinopril (ZESTRIL) 20 MG tablet TAKE 1 TABLET DAILY 90 tablet 3   metFORMIN (GLUCOPHAGE-XR) 500 MG 24 hr tablet TAKE TWO TABLETS ONCE DAILY WITH BREAKFAST 180 tablet 1   Multiple Vitamin (MULTIVITAMIN) tablet Take 1 tablet by mouth daily.     rosuvastatin (CRESTOR) 10 MG tablet TAKE ONE TABLET BY MOUTH EVERY DAY 90 tablet 3   No current facility-administered medications on file prior to visit.     Review of Systems  Constitutional:  Negative for fever.  Eyes:  Positive for visual disturbance (blurry vision - eye exam - "not eyes").  Respiratory:  Negative for cough, shortness of breath and wheezing.   Cardiovascular:  Positive for palpitations (occ). Negative for chest pain and leg swelling.  Neurological:  Negative for light-headedness and headaches.       Objective:   Vitals:   01/26/23 1005  BP: 136/84  Pulse:  71  Temp: 98.1 F (36.7 C)  SpO2: 96%   BP Readings from Last 3 Encounters:  01/26/23 136/84  10/24/22 134/70  07/28/22 126/80   Wt Readings from Last 3 Encounters:  01/26/23 144 lb 9.6 oz (65.6 kg)  10/24/22 148 lb 3.2 oz (67.2 kg)  07/28/22 152 lb (68.9 kg)   Body mass index is 28.24 kg/m.    Physical Exam Constitutional:      General: She is not in acute distress.    Appearance: Normal appearance.  HENT:     Head: Normocephalic and atraumatic.  Eyes:     Conjunctiva/sclera: Conjunctivae normal.  Cardiovascular:     Rate and Rhythm: Normal rate and regular rhythm.     Heart sounds: Normal heart sounds.  Pulmonary:     Effort: Pulmonary effort is normal. No respiratory distress.     Breath sounds: Normal breath sounds. No wheezing.  Musculoskeletal:     Cervical back: Neck supple.     Right lower leg: No edema.     Left lower leg: No edema.  Lymphadenopathy:     Cervical: No cervical adenopathy.  Skin:    General: Skin is warm and dry.     Findings: No rash.  Neurological:     Mental Status: She is alert. Mental status is at  baseline.  Psychiatric:        Mood and Affect: Mood normal.        Behavior: Behavior normal.        Lab Results  Component Value Date   WBC 5.6 07/28/2022   HGB 10.7 (L) 07/28/2022   HCT 33.8 (L) 07/28/2022   PLT 362.0 07/28/2022   GLUCOSE 108 (H) 07/28/2022   CHOL 126 07/28/2022   TRIG 71.0 07/28/2022   HDL 46.40 07/28/2022   LDLCALC 66 07/28/2022   ALT 23 07/28/2022   AST 24 07/28/2022   NA 139 07/28/2022   K 4.2 07/28/2022   CL 103 07/28/2022   CREATININE 0.72 07/28/2022   BUN 13 07/28/2022   CO2 30 07/28/2022   TSH 2.73 07/28/2022   HGBA1C 6.9 (H) 07/28/2022   MICROALBUR 3.3 (H) 07/28/2022     Assessment & Plan:    See Problem List for Assessment and Plan of chronic medical problems.

## 2023-01-26 ENCOUNTER — Ambulatory Visit: Payer: PPO | Admitting: Internal Medicine

## 2023-01-26 VITALS — BP 136/84 | HR 71 | Temp 98.1°F | Ht 60.0 in | Wt 144.6 lb

## 2023-01-26 DIAGNOSIS — D649 Anemia, unspecified: Secondary | ICD-10-CM

## 2023-01-26 DIAGNOSIS — E210 Primary hyperparathyroidism: Secondary | ICD-10-CM

## 2023-01-26 DIAGNOSIS — E119 Type 2 diabetes mellitus without complications: Secondary | ICD-10-CM | POA: Diagnosis not present

## 2023-01-26 DIAGNOSIS — M85852 Other specified disorders of bone density and structure, left thigh: Secondary | ICD-10-CM | POA: Diagnosis not present

## 2023-01-26 DIAGNOSIS — D75839 Thrombocytosis, unspecified: Secondary | ICD-10-CM | POA: Diagnosis not present

## 2023-01-26 DIAGNOSIS — H538 Other visual disturbances: Secondary | ICD-10-CM | POA: Insufficient documentation

## 2023-01-26 DIAGNOSIS — M85851 Other specified disorders of bone density and structure, right thigh: Secondary | ICD-10-CM | POA: Diagnosis not present

## 2023-01-26 DIAGNOSIS — E782 Mixed hyperlipidemia: Secondary | ICD-10-CM | POA: Diagnosis not present

## 2023-01-26 DIAGNOSIS — I10 Essential (primary) hypertension: Secondary | ICD-10-CM

## 2023-01-26 LAB — COMPREHENSIVE METABOLIC PANEL
ALT: 19 U/L (ref 0–35)
AST: 22 U/L (ref 0–37)
Albumin: 4.7 g/dL (ref 3.5–5.2)
Alkaline Phosphatase: 67 U/L (ref 39–117)
BUN: 13 mg/dL (ref 6–23)
CO2: 28 mEq/L (ref 19–32)
Calcium: 10.8 mg/dL — ABNORMAL HIGH (ref 8.4–10.5)
Chloride: 99 mEq/L (ref 96–112)
Creatinine, Ser: 0.66 mg/dL (ref 0.40–1.20)
GFR: 91.25 mL/min (ref >60.00)
Glucose, Bld: 103 mg/dL — ABNORMAL HIGH (ref 70–99)
Potassium: 3.8 mEq/L (ref 3.5–5.1)
Sodium: 133 mEq/L — ABNORMAL LOW (ref 135–145)
Total Bilirubin: 0.7 mg/dL (ref 0.2–1.2)
Total Protein: 8.2 g/dL (ref 6.0–8.3)

## 2023-01-26 LAB — FERRITIN: Ferritin: 24.6 ng/mL (ref 10.0–291.0)

## 2023-01-26 LAB — IBC PANEL
Iron: 58 ug/dL (ref 42–145)
Saturation Ratios: 16.5 % — ABNORMAL LOW (ref 20.0–50.0)
TIBC: 351.4 ug/dL (ref 250.0–450.0)
Transferrin: 251 mg/dL (ref 212.0–360.0)

## 2023-01-26 LAB — CBC WITH DIFFERENTIAL/PLATELET
Basophils Absolute: 0 10*3/uL (ref 0.0–0.1)
Basophils Relative: 0.3 % (ref 0.0–3.0)
Eosinophils Absolute: 0.2 10*3/uL (ref 0.0–0.7)
Eosinophils Relative: 3.1 % (ref 0.0–5.0)
HCT: 33.8 % — ABNORMAL LOW (ref 36.0–46.0)
Hemoglobin: 10.4 g/dL — ABNORMAL LOW (ref 12.0–15.0)
Lymphocytes Relative: 38.1 % (ref 12.0–46.0)
Lymphs Abs: 2.2 10*3/uL (ref 0.7–4.0)
MCHC: 30.7 g/dL (ref 30.0–36.0)
MCV: 69.9 fl — ABNORMAL LOW (ref 78.0–100.0)
Monocytes Absolute: 0.4 10*3/uL (ref 0.1–1.0)
Monocytes Relative: 7.2 % (ref 3.0–12.0)
Neutro Abs: 3 10*3/uL (ref 1.4–7.7)
Neutrophils Relative %: 51.3 % (ref 43.0–77.0)
Platelets: 407 10*3/uL — ABNORMAL HIGH (ref 150.0–400.0)
RBC: 4.84 Mil/uL (ref 3.87–5.11)
RDW: 16.8 % — ABNORMAL HIGH (ref 11.5–15.5)
WBC: 5.8 10*3/uL (ref 4.0–10.5)

## 2023-01-26 LAB — LIPID PANEL
Cholesterol: 145 mg/dL (ref 0–200)
HDL: 53.6 mg/dL (ref >39.00)
LDL Cholesterol: 78 mg/dL (ref 0–99)
NonHDL: 91.2
Total CHOL/HDL Ratio: 3
Triglycerides: 65 mg/dL (ref 0.0–149.0)
VLDL: 13 mg/dL (ref 0.0–40.0)

## 2023-01-26 LAB — VITAMIN D 25 HYDROXY (VIT D DEFICIENCY, FRACTURES): VITD: 50.24 ng/mL (ref 30.00–100.00)

## 2023-01-26 LAB — VITAMIN B12: Vitamin B-12: >1501 pg/mL — ABNORMAL HIGH (ref 211–911)

## 2023-01-26 LAB — FOLATE: Folate: >24.2 ng/mL (ref >5.9)

## 2023-01-26 LAB — HEMOGLOBIN A1C: Hgb A1c MFr Bld: 6.1 % (ref 4.6–6.5)

## 2023-01-26 MED ORDER — VALSARTAN 80 MG PO TABS: 80.0000 mg | ORAL_TABLET | Freq: Every day | ORAL | 3 refills | Status: DC

## 2023-01-26 NOTE — Assessment & Plan Note (Addendum)
Chronic Blood pressure controlled Discussed goal Encouraged regular exercise, healthy diet CMP Continue amlodipine 5 mg daily Will discontinue lisinopril to see if it helps relieve her blurry vision and start valsartan 80 mg daily Advised that she needs to monitor her BP since we are changing medications

## 2023-01-26 NOTE — Assessment & Plan Note (Signed)
Chronic   Lab Results  Component Value Date   HGBA1C 6.9 (H) 07/28/2022   Sugars controlled Check A1c, Continue metformin XR 1000 mg daily with breakfast Stressed regular exercise, diabetic diet

## 2023-01-26 NOTE — Assessment & Plan Note (Signed)
Has had blurry vision for a while-it is both eyes with distance and close of vision Saw her eye doctor who states her eyes are good and it is not related to her eyes She is concerned it may be related to lisinopril which she read could cause blurry vision and was wondering about stopping that Will replace lisinopril with a different medication to see if that helps

## 2023-01-26 NOTE — Assessment & Plan Note (Signed)
Chronic DEXA up-to-date She is very active and does a lot of walking at work Continue vitamin D daily, multivitamin

## 2023-01-26 NOTE — Assessment & Plan Note (Addendum)
Chronic Saw endocrine-stopped her multivitamin with calcium and is now taking a multivitamin without calcium Will check CMP, PTH DEXA up-to-date Per endocrine the plan was to monitor since bone density was good

## 2023-01-26 NOTE — Assessment & Plan Note (Signed)
Chronic Likely genetic Check CBC, iron panel, B12, folate

## 2023-01-26 NOTE — Assessment & Plan Note (Signed)
Chronic Regular exercise and healthy diet encouraged Check lipid panel, CMP Continue Crestor 10 mg daily 

## 2023-01-26 NOTE — Assessment & Plan Note (Signed)
Chronic Improved CBC

## 2023-01-28 LAB — PTH, INTACT AND CALCIUM
Calcium: 10.8 mg/dL — ABNORMAL HIGH (ref 8.6–10.4)
PTH: 100 pg/mL — ABNORMAL HIGH (ref 16–77)

## 2023-01-31 ENCOUNTER — Telehealth: Payer: Self-pay | Admitting: Internal Medicine

## 2023-01-31 NOTE — Telephone Encounter (Signed)
Has she been checking her BP and HR - please have her do that.  We can change the medication but I need to know what her BP and HR is.  If BP low can try decreasing valsartan to 40 mg ( 1/2 Pill)

## 2023-01-31 NOTE — Telephone Encounter (Signed)
Pt called stating that Dr. Lawerance Bach change her blood medication to Valsartan and she been having headaches and fast heart beats. Pt wants to know what Dr. Lawerance Bach can do about this because she is very nervous about her side effects. Please advise.

## 2023-02-01 ENCOUNTER — Other Ambulatory Visit: Payer: Self-pay

## 2023-02-01 MED ORDER — LISINOPRIL 20 MG PO TABS: 20.0000 mg | ORAL_TABLET | Freq: Every day | ORAL | 3 refills | Status: DC

## 2023-02-01 NOTE — Telephone Encounter (Signed)
Spoke with patient today. 

## 2023-03-07 ENCOUNTER — Other Ambulatory Visit: Payer: Self-pay | Admitting: Internal Medicine

## 2023-03-11 ENCOUNTER — Emergency Department (HOSPITAL_COMMUNITY)
Admission: EM | Admit: 2023-03-11 | Discharge: 2023-03-12 | Disposition: A | Discharge status: Home / Self Care | Payer: PPO | Attending: Emergency Medicine | Admitting: Emergency Medicine

## 2023-03-11 ENCOUNTER — Emergency Department (HOSPITAL_COMMUNITY): Payer: PPO

## 2023-03-11 ENCOUNTER — Other Ambulatory Visit: Payer: Self-pay

## 2023-03-11 ENCOUNTER — Encounter (HOSPITAL_COMMUNITY): Payer: Self-pay

## 2023-03-11 DIAGNOSIS — I10 Essential (primary) hypertension: Secondary | ICD-10-CM | POA: Diagnosis not present

## 2023-03-11 DIAGNOSIS — R002 Palpitations: Secondary | ICD-10-CM | POA: Diagnosis not present

## 2023-03-11 DIAGNOSIS — R030 Elevated blood-pressure reading, without diagnosis of hypertension: Secondary | ICD-10-CM | POA: Diagnosis not present

## 2023-03-11 DIAGNOSIS — D649 Anemia, unspecified: Secondary | ICD-10-CM | POA: Diagnosis not present

## 2023-03-11 DIAGNOSIS — Z7982 Long term (current) use of aspirin: Secondary | ICD-10-CM | POA: Insufficient documentation

## 2023-03-11 DIAGNOSIS — Z79899 Other long term (current) drug therapy: Secondary | ICD-10-CM | POA: Diagnosis not present

## 2023-03-11 DIAGNOSIS — R079 Chest pain, unspecified: Secondary | ICD-10-CM | POA: Diagnosis not present

## 2023-03-11 LAB — CBC
HCT: 35.4 % — ABNORMAL LOW (ref 36.0–46.0)
Hemoglobin: 10.5 g/dL — ABNORMAL LOW (ref 12.0–15.0)
MCH: 22.1 pg — ABNORMAL LOW (ref 26.0–34.0)
MCHC: 29.7 g/dL — ABNORMAL LOW (ref 30.0–36.0)
MCV: 74.5 fL — ABNORMAL LOW (ref 80.0–100.0)
Platelets: 332 10*3/uL (ref 150–400)
RBC: 4.75 MIL/uL (ref 3.87–5.11)
RDW: 17.5 % — ABNORMAL HIGH (ref 11.5–15.5)
WBC: 6.8 10*3/uL (ref 4.0–10.5)
nRBC: 0 % (ref 0.0–0.2)

## 2023-03-11 LAB — BASIC METABOLIC PANEL
Anion gap: 9 (ref 5–15)
BUN: 13 mg/dL (ref 8–23)
CO2: 29 mmol/L (ref 22–32)
Calcium: 10.7 mg/dL — ABNORMAL HIGH (ref 8.9–10.3)
Chloride: 99 mmol/L (ref 98–111)
Creatinine, Ser: 0.6 mg/dL (ref 0.44–1.00)
GFR, Estimated: >60 mL/min (ref >60)
Glucose, Bld: 138 mg/dL — ABNORMAL HIGH (ref 70–99)
Potassium: 3.4 mmol/L — ABNORMAL LOW (ref 3.5–5.1)
Sodium: 137 mmol/L (ref 135–145)

## 2023-03-11 LAB — TROPONIN I (HIGH SENSITIVITY): Troponin I (High Sensitivity): 4 ng/L (ref <18)

## 2023-03-11 NOTE — ED Provider Notes (Signed)
[name] AT [facility] Provider Note   CSN: [id number] Arrival date & time: 03/11/23  2139     History  Chief Complaint  Patient presents with   Hypertension   Chest Pain    Marcia Garcia is a 67 y.o. female.  The history is provided by the patient.  Hypertension Associated symptoms include chest pain. Pertinent negatives include no headaches and no shortness of breath.  Chest Pain Associated symptoms: no fever, no headache, no shortness of breath, no vomiting and no weakness    Patient with history of hypertension presents for elevated blood pressure.  Patient reports earlier in the day she felt that her heart was accelerating.  She had some palpitations.  No chest pain or shortness of breath.  She wanted to check her blood pressure multiple times that [facility] went to [facility] and noted that it was elevated. No new headache.  No fevers or vomiting.  She has been taking her medications as prescribed and has been taking some extra doses of lisinopril. No previous history of stroke or CAD    Home Medications Prior to Admission medications   Medication Sig Start Date End Date Taking? Authorizing Provider  amLODipine (NORVASC) 5 MG tablet TAKE ONE TABLET DAILY 03/07/23   Pincus Sanes, MD  aspirin EC 81 MG tablet Take 1 tablet by mouth daily.    [provider]  blood glucose meter kit and supplies KIT One touch glucometer.  Use daily and as needed to check sugars as directed.  (FOR E11.9). 05/10/20   Pincus Sanes, MD  Cholecalciferol (VITAMIN D) 50 MCG (2000 UT) CAPS Take by mouth.    [provider]  docusate sodium (COLACE) 50 MG capsule Take 50 mg by mouth 2 (two) times daily.    [provider]  lisinopril (ZESTRIL) 20 MG tablet Take 1 tablet (20 mg total) by mouth daily. 02/01/23   Pincus Sanes, MD  metFORMIN (GLUCOPHAGE-XR) 500 MG 24 hr tablet TAKE TWO TABLETS ONCE DAILY WITH BREAKFAST 11/27/22   Pincus Sanes, MD  Multiple Vitamin (MULTIVITAMIN) tablet Take 1 tablet by mouth daily. Does not have calcium    [provider]  rosuvastatin (CRESTOR) 10 MG tablet TAKE ONE TABLET BY MOUTH EVERY DAY 03/14/22   Pincus Sanes, MD      Allergies    Atorvastatin    Review of Systems   Review of Systems  Constitutional:  Negative for fever.  Eyes:  Negative for visual disturbance.  Respiratory:  Negative for shortness of breath.   Cardiovascular:  Positive for chest pain.  Gastrointestinal:  Negative for vomiting.  Neurological:  Negative for syncope, weakness and headaches.    Physical Exam Updated Vital Signs BP (!) 192/82   Pulse 73   Temp 98.4 F (36.9 C) (Oral)   Resp (!) 22   Ht 1.524 m (5')   Wt 65.6 kg   SpO2 99%   BMI 28.24 kg/m  Physical Exam CONSTITUTIONAL: Well developed/well nourished HEAD: Normocephalic/atraumatic EYES: EOMI/PERRL, no nystagmus ENMT: Mucous membranes moist NECK: supple no meningeal signs CV: S1/S2 noted, no murmurs/rubs/gallops noted LUNGS: Lungs are clear to auscultation bilaterally, no apparent distress ABDOMEN: soft, nontender, no rebound or guarding GU:no cva tenderness NEURO:Awake/alert, face symmetric, no arm or leg drift is noted Equal 5/5 strength with shoulder abduction, elbow flex/extension, wrist flex/extension in upper extremities and equal hand grips bilaterally Equal 5/5 strength with hip flexion,knee flex/extension, foot dorsi/plantar  flexion Cranial nerves 3/4/5/6/12/18/08/11/12 tested and intact Gait normal without ataxia No past pointing Sensation to light touch intact in all extremities SKIN: warm, color normal PSYCH: no abnormalities of mood noted, alert and oriented to situation   ED Results / Procedures / Treatments   Labs (all labs ordered are listed, but only abnormal results are displayed) Labs Reviewed  BASIC METABOLIC PANEL - Abnormal; Notable for the following components:      Result Value   Potassium 3.4 (*)     Glucose, Bld 138 (*)    Calcium 10.7 (*)    All other components within normal limits  CBC - Abnormal; Notable for the following components:   Hemoglobin 10.5 (*)    HCT 35.4 (*)    MCV 74.5 (*)    MCH 22.1 (*)    MCHC 29.7 (*)    RDW 17.5 (*)    All other components within normal limits  TROPONIN I (HIGH SENSITIVITY)    EKG EKG Interpretation Date/Time:  Sunday March 11 2023 23:26:27 EDT Ventricular Rate:  69 PR Interval:  184 QRS Duration:  92 QT Interval:  385 QTC Calculation: 413 R Axis:   44  Text Interpretation: Sinus rhythm Normal ECG Confirmed by Zadie Rhine ([id number]) on 03/11/2023 11:43:38 PM  Radiology DG Chest 2 View  Result Date: 03/11/2023 CLINICAL DATA:  Chest pain.  Elevated blood pressure. EXAM: CHEST - 2 VIEW COMPARISON:  None Available. FINDINGS: The heart size and mediastinal contours are within normal limits. Both lungs are clear. The visualized skeletal structures are unremarkable. IMPRESSION: No active cardiopulmonary disease. Electronically Signed   By: Burman Nieves M.D.   On: 03/11/2023 22:38    Procedures Procedures    Medications Ordered in ED Medications - No data to display  ED Course/ Medical Decision Making/ A&P                                 Medical Decision Making Amount and/or Complexity of Data Reviewed Labs: ordered. Radiology: ordered. ECG/medicine tests: ordered.   This patient presents to the ED for concern of palpitatons, this involves an extensive number of treatment options, and is a complaint that carries with it a high risk of complications and morbidity.  The differential diagnosis includes but is not limited to SVT, atrial fibrillation, atrial flutter, ventricular tachycardia, sinus tachycardia, anxiety  Comorbidities that complicate the patient evaluation: Patient's presentation is complicated by their history of hypertension  Additional history obtained: Records reviewed Primary Care Documents  Lab  Tests: I Ordered, and personally interpreted labs.  The pertinent results include: Mild anemia  Imaging Studies ordered: I ordered imaging studies including chest x-ray I independently visualized and interpreted imaging which showed no acute findings I agree with the radiologist interpretation  Cardiac Monitoring: The patient was maintained on a cardiac monitor.  I personally viewed and interpreted the cardiac monitor which showed an underlying rhythm of:  sinus rhythm   Reevaluation: After the interventions noted above, I reevaluated the patient and found that they have :stayed the same  Complexity of problems addressed: Patient's presentation is most consistent with  acute presentation with potential threat to life or bodily function  Disposition: After consideration of the diagnostic results and the patient's response to treatment,  I feel that the patent would benefit from discharge   .    Patient presents for elevated blood pressure.  She checked it multiple times throughout the  day as she felt some palpitations.  No chest pain or shortness of breath.  No focal weakness.  She is smiling and well-appearing.  She walks around the room in no distress. She should continue all of her medications and follow-up as an outpatient       Final Clinical Impression(s) / ED Diagnoses Final diagnoses:  Primary hypertension  Palpitations    Rx / DC Orders ED Discharge Orders     None         Zadie Rhine, MD 03/12/23 0000

## 2023-03-11 NOTE — ED Triage Notes (Addendum)
[facility] reports [facility] and checked BP around noon, it was 153/79, pt reports taking BP meds, then before coming to ED pt went to [facility] to check BP and read 188/90. BP in triage is 192/82. Pt reports anxiety, and "a little weak"

## 2023-03-13 ENCOUNTER — Encounter: Payer: Self-pay | Admitting: Internal Medicine

## 2023-03-13 NOTE — Progress Notes (Unsigned)
Subjective:    Patient ID: Marcia Garcia, female    DOB: 11/19/1955, 67 y.o.   MRN: [id number]     HPI Reeta is here for follow up from the ED  ED 9/29 for htn, chest pain  Earlier that day her heart was accelerating - had palpitations.  No CP or SOB.  [facility] to check BP and it was elevated.  No headache.  Was taking extra doses of lisinopril.    BP 192/82 in ED   EKG, CXR, cardiac monitor, troponin negative.  No change in medications.  Her BP has been elevated since the ED.  Still with symptoms - not feeling great.  She is eating healthy, walks daily.  Concerned about BP.    Medications and allergies reviewed with patient and updated if appropriate.  Current Outpatient Medications on File Prior to Visit  Medication Sig Dispense Refill   amLODipine (NORVASC) 5 MG tablet TAKE ONE TABLET DAILY 90 tablet 3   aspirin EC 81 MG tablet Take 1 tablet by mouth daily.     blood glucose meter kit and supplies KIT One touch glucometer.  Use daily and as needed to check sugars as directed.  (FOR E11.9). 1 each 0   Cholecalciferol (VITAMIN D) 50 MCG (2000 UT) CAPS Take by mouth.     docusate sodium (COLACE) 50 MG capsule Take 50 mg by mouth 2 (two) times daily.     lisinopril (ZESTRIL) 20 MG tablet Take 1 tablet (20 mg total) by mouth daily. 90 tablet 3   metFORMIN (GLUCOPHAGE-XR) 500 MG 24 hr tablet TAKE TWO TABLETS ONCE DAILY WITH BREAKFAST 180 tablet 1   Multiple Vitamin (MULTIVITAMIN) tablet Take 1 tablet by mouth daily. Does not have calcium     rosuvastatin (CRESTOR) 10 MG tablet TAKE ONE TABLET BY MOUTH EVERY DAY 90 tablet 3   No current facility-administered medications on file prior to visit.     Review of Systems  Respiratory:  Positive for shortness of breath (maybe a little).   Cardiovascular:  Positive for chest pain and palpitations. Negative for leg swelling.  Neurological:  Positive for dizziness (mild) and headaches (only once when she went to  ED). Negative for light-headedness.       Objective:   Vitals:   03/14/23 1614 03/14/23 1621  BP: (!) 160/94 (!) 168/84  Pulse: 74   Temp: 98.4 F (36.9 C)   SpO2: 99%    BP Readings from Last 3 Encounters:  03/14/23 (!) 168/84  03/12/23 (!) 166/84  01/26/23 136/84   Wt Readings from Last 3 Encounters:  03/14/23 146 lb (66.2 kg)  03/11/23 144 lb 10 oz (65.6 kg)  01/26/23 144 lb 9.6 oz (65.6 kg)   Body mass index is 28.51 kg/m.    Physical Exam Constitutional:      General: She is not in acute distress.    Appearance: Normal appearance.  HENT:     Head: Normocephalic and atraumatic.  Eyes:     Conjunctiva/sclera: Conjunctivae normal.  Cardiovascular:     Rate and Rhythm: Normal rate and regular rhythm.     Heart sounds: Murmur (2/6 systolic) heard.  Pulmonary:     Effort: Pulmonary effort is normal. No respiratory distress.     Breath sounds: Normal breath sounds. No wheezing.  Musculoskeletal:     Cervical back: Neck supple.     Right lower leg: No edema.     Left lower leg:  No edema.  Lymphadenopathy:     Cervical: No cervical adenopathy.  Skin:    General: Skin is warm and dry.     Findings: No rash.  Neurological:     Mental Status: She is alert. Mental status is at baseline.  Psychiatric:        Mood and Affect: Mood normal.        Behavior: Behavior normal.        Lab Results  Component Value Date   WBC 6.8 03/11/2023   HGB 10.5 (L) 03/11/2023   HCT 35.4 (L) 03/11/2023   PLT 332 03/11/2023   GLUCOSE 138 (H) 03/11/2023   CHOL 145 01/26/2023   TRIG 65.0 01/26/2023   HDL 53.60 01/26/2023   LDLCALC 78 01/26/2023   ALT 19 01/26/2023   AST 22 01/26/2023   NA 137 03/11/2023   K 3.4 (L) 03/11/2023   CL 99 03/11/2023   CREATININE 0.60 03/11/2023   BUN 13 03/11/2023   CO2 29 03/11/2023   TSH 2.73 07/28/2022   HGBA1C 6.1 01/26/2023   MICROALBUR 3.3 (H) 07/28/2022     Assessment & Plan:    See Problem List for Assessment and Plan of  chronic medical problems.

## 2023-03-13 NOTE — Patient Instructions (Incomplete)
      Blood work was ordered.   The lab is on the first floor.    Medications changes include :       A referral was ordered and someone will call you to schedule an appointment.     No follow-ups on file.  

## 2023-03-14 ENCOUNTER — Ambulatory Visit: Payer: PPO | Admitting: Internal Medicine

## 2023-03-14 VITALS — BP 168/84 | HR 74 | Temp 98.4°F | Ht 60.0 in | Wt 146.0 lb

## 2023-03-14 DIAGNOSIS — I10 Essential (primary) hypertension: Secondary | ICD-10-CM | POA: Diagnosis not present

## 2023-03-14 NOTE — Assessment & Plan Note (Signed)
Chronic Was controlled up until recently - has been persistently elevated - not sure why Discussed most important thing is to get BP well controlled and keep it controlled Discussed options Continue amlodipine 5 mg daily Increase lisinopril to 40 mg daily Monitor BP  F/u in 2 weeks - will get labs at that time Call sooner with questions/concerns

## 2023-03-21 ENCOUNTER — Telehealth: Payer: Self-pay | Admitting: Internal Medicine

## 2023-03-21 MED ORDER — AMLODIPINE BESYLATE 5 MG PO TABS: 10.0000 mg | ORAL_TABLET | Freq: Every day | ORAL | Status: DC

## 2023-03-21 NOTE — Telephone Encounter (Signed)
When she was here last we discussed possibly changing her back to the losartan from lisinopril, but she stated she did not want to keep switching back and forth and wanted to stay on the lisinopril.  We increased this to 40 mg daily.  She should be taking 5 mg of amlodipine, but okay to increase to 10-she can take 2 of her amlodipine daily in addition to the 40 mg of lisinopril.

## 2023-03-21 NOTE — Telephone Encounter (Signed)
Ms. Zale called and said that when she woke up at 5:30 her bp was 169/90.  This was before patient took her medication.  She took 2 of the amlodopine and bp was 159/87.  She feels that this is still not good, no palpitations but heaviness in chest still present.  How long does she have to be off the lisinopril before she starts the losartan.  She last took the lisionpril at 8:00 am.  Please call patient and let her know.  [phone].  At 6 this morning she took 10 mg. Of amlodopine.  Trying to get on something until she can start something else.  Please call ASAP

## 2023-03-22 MED ORDER — VALSARTAN 80 MG PO TABS: 80.0000 mg | ORAL_TABLET | Freq: Every day | ORAL | Status: DC

## 2023-03-22 NOTE — Telephone Encounter (Signed)
Okay so currently she is taking amlodipine 10 mg daily and valsartan 80 mg daily, correct?    What is her blood pressure reading-we can adjust medication depending on her readings.

## 2023-03-23 ENCOUNTER — Other Ambulatory Visit: Payer: Self-pay | Admitting: Internal Medicine

## 2023-03-23 MED ORDER — AMLODIPINE BESYLATE 5 MG PO TABS: 5.0000 mg | ORAL_TABLET | Freq: Every day | ORAL | Status: DC

## 2023-03-23 NOTE — Telephone Encounter (Signed)
She will monitor on current meds

## 2023-03-27 ENCOUNTER — Ambulatory Visit: Payer: PPO | Admitting: Internal Medicine

## 2023-03-27 NOTE — Patient Instructions (Addendum)
Blood work was ordered.   The lab is on the first floor.    Medications changes include :       A referral was ordered and someone will call you to schedule an appointment.     Return for follow up as scheduled.

## 2023-03-27 NOTE — Progress Notes (Unsigned)
Subjective:    Patient ID: Marcia Garcia, female    DOB: [birth year], 67 y.o.   MRN: [id number]     HPI Marcia Garcia is here for follow up of her chronic medical problems.  She did not tolerate the lisinopril.   She is now taking valsartan 160 mg daily and amlodipine 5 mg daily.    BP at home - 160/90, 150/87  She is compliant with a low salt diet.  She is exercising.  She is drinking a lot of water.   Medications and allergies reviewed with patient and updated if appropriate.  Current Outpatient Medications on File Prior to Visit  Medication Sig Dispense Refill   amLODipine (NORVASC) 5 MG tablet Take 1 tablet (5 mg total) by mouth daily.     aspirin EC 81 MG tablet Take 1 tablet by mouth daily.     blood glucose meter kit and supplies KIT One touch glucometer.  Use daily and as needed to check sugars as directed.  (FOR E11.9). 1 each 0   Cholecalciferol (VITAMIN D) 50 MCG (2000 UT) CAPS Take by mouth.     docusate sodium (COLACE) 50 MG capsule Take 50 mg by mouth 2 (two) times daily.     metFORMIN (GLUCOPHAGE-XR) 500 MG 24 hr tablet TAKE TWO TABLETS ONCE DAILY WITH BREAKFAST 180 tablet 1   Multiple Vitamin (MULTIVITAMIN) tablet Take 1 tablet by mouth daily. Does not have calcium     rosuvastatin (CRESTOR) 10 MG tablet TAKE ONE TABLET BY MOUTH EVERY DAY 90 tablet 3   valsartan (DIOVAN) 80 MG tablet Take 1 tablet (80 mg total) by mouth daily.     No current facility-administered medications on file prior to visit.     Review of Systems  Respiratory:  Positive for shortness of breath (occ). Negative for cough and wheezing.   Cardiovascular:  Positive for chest pain (pressue in chest). Negative for palpitations and leg swelling.       Objective:   Vitals:   03/28/23 1412 03/28/23 1418  BP: (!) 150/90 (!) 148/86  Pulse: 93   Temp: 98.1 F (36.7 C)   SpO2: 97%    BP Readings from Last 3 Encounters:  03/28/23 (!) 148/86  03/14/23 (!) 168/84  03/12/23 (!) 166/84    Wt Readings from Last 3 Encounters:  03/28/23 146 lb (66.2 kg)  03/14/23 146 lb (66.2 kg)  03/11/23 144 lb 10 oz (65.6 kg)   Body mass index is 28.51 kg/m.    Physical Exam Constitutional:      General: She is not in acute distress.    Appearance: Normal appearance.  HENT:     Head: Normocephalic and atraumatic.  Eyes:     Conjunctiva/sclera: Conjunctivae normal.  Cardiovascular:     Rate and Rhythm: Normal rate and regular rhythm.     Heart sounds: Murmur (2/6 systolic) heard.  Pulmonary:     Effort: Pulmonary effort is normal. No respiratory distress.     Breath sounds: Normal breath sounds. No wheezing.  Musculoskeletal:     Cervical back: Neck supple.     Right lower leg: No edema.     Left lower leg: No edema.  Lymphadenopathy:     Cervical: No cervical adenopathy.  Skin:    General: Skin is warm and dry.     Findings: No rash.  Neurological:     Mental Status: She is alert. Mental status is at baseline.  Psychiatric:  Mood and Affect: Mood normal.        Behavior: Behavior normal.        Lab Results  Component Value Date   WBC 6.8 03/11/2023   HGB 10.5 (L) 03/11/2023   HCT 35.4 (L) 03/11/2023   PLT 332 03/11/2023   GLUCOSE 138 (H) 03/11/2023   CHOL 145 01/26/2023   TRIG 65.0 01/26/2023   HDL 53.60 01/26/2023   LDLCALC 78 01/26/2023   ALT 19 01/26/2023   AST 22 01/26/2023   NA 137 03/11/2023   K 3.4 (L) 03/11/2023   CL 99 03/11/2023   CREATININE 0.60 03/11/2023   BUN 13 03/11/2023   CO2 29 03/11/2023   TSH 2.73 07/28/2022   HGBA1C 6.1 01/26/2023   MICROALBUR 3.3 (H) 07/28/2022     Assessment & Plan:    See Problem List for Assessment and Plan of chronic medical problems.

## 2023-03-28 ENCOUNTER — Ambulatory Visit: Payer: PPO | Admitting: Internal Medicine

## 2023-03-28 ENCOUNTER — Ambulatory Visit (INDEPENDENT_AMBULATORY_CARE_PROVIDER_SITE_OTHER): Payer: PPO | Admitting: Internal Medicine

## 2023-03-28 ENCOUNTER — Encounter: Payer: Self-pay | Admitting: Internal Medicine

## 2023-03-28 VITALS — BP 148/86 | HR 93 | Temp 98.1°F | Ht 60.0 in | Wt 146.0 lb

## 2023-03-28 DIAGNOSIS — I10 Essential (primary) hypertension: Secondary | ICD-10-CM | POA: Diagnosis not present

## 2023-03-28 DIAGNOSIS — Z136 Encounter for screening for cardiovascular disorders: Secondary | ICD-10-CM

## 2023-03-28 LAB — BASIC METABOLIC PANEL
BUN: 12 mg/dL (ref 6–23)
CO2: 30 meq/L (ref 19–32)
Calcium: 11 mg/dL — ABNORMAL HIGH (ref 8.4–10.5)
Chloride: 103 meq/L (ref 96–112)
Creatinine, Ser: 0.67 mg/dL (ref 0.40–1.20)
GFR: 90.82 mL/min (ref >60.00)
Glucose, Bld: 108 mg/dL — ABNORMAL HIGH (ref 70–99)
Potassium: 4.4 meq/L (ref 3.5–5.1)
Sodium: 138 meq/L (ref 135–145)

## 2023-03-28 MED ORDER — AMLODIPINE BESYLATE 10 MG PO TABS: 10.0000 mg | ORAL_TABLET | Freq: Every day | ORAL | Status: DC

## 2023-03-28 MED ORDER — VALSARTAN 160 MG PO TABS: 160.0000 mg | ORAL_TABLET | Freq: Every day | ORAL | Status: DC

## 2023-03-28 NOTE — Assessment & Plan Note (Addendum)
Chronic Goal BP < 130/80 Taking valsartan 160 mg daily and amlodipine 5 mg daily BP not ideally controlled Increase amlodipine to 10 mg daily Continue valsartan 160 mg daily Bmp Monitor BP at home - she will update [place] w/ readings in 2 weeks - will further adjust medication if needed at that time.

## 2023-04-11 ENCOUNTER — Telehealth: Payer: Self-pay | Admitting: Internal Medicine

## 2023-04-11 ENCOUNTER — Encounter: Payer: Self-pay | Admitting: Internal Medicine

## 2023-04-11 NOTE — Telephone Encounter (Signed)
Patient states that she was told to call and give Dr. Lawerance Bach her blood pressure readings  10/04  179/86 Pulse of 73 10/07   136/84 Pulse of 74 10/08   137/81 pulse of 67 10/26   107/55 Pulse of 82 10/27  129/86 Pulse of 69 10/30  138/89  Pulse of 80

## 2023-04-11 NOTE — Telephone Encounter (Signed)
More than 50% of her readings are higher than ideal.  We should adjust her medication to make sure her blood pressure is slightly better controlled-we could add a low-dose of another medication to see how she does with that or the other option is increasing the valsartan to the next dose up.  Ideally we want her blood pressure readings to be consistently less than 130/80, but not too low   If she okay with further adjustment in her medication

## 2023-04-12 ENCOUNTER — Other Ambulatory Visit: Payer: Self-pay

## 2023-04-12 NOTE — Telephone Encounter (Signed)
Currently she is taking amlodipne 10 mg daily and valsartan 160 mg correct?   So increasing the dose would be valsartan 320 mg daily - she could try 160 mg bid to see how that helps and let us know if a couple of weeks

## 2023-04-18 ENCOUNTER — Telehealth: Payer: Self-pay

## 2023-04-18 NOTE — Telephone Encounter (Signed)
Transition Care Management Follow-up Telephone Call Date of discharge and from where: Marcia Garcia 9/30 How have you been since you were released from the hospital? Doing well and has been following up with providers Any questions or concerns? No  Items Reviewed: Did the pt receive and understand the discharge instructions provided? Yes  Medications obtained and verified? Yes  Other? No  Any new allergies since your discharge? No  Dietary orders reviewed? No Do you have support at home? Yes     Follow up appointments reviewed:  PCP Hospital f/u appt confirmed? Yes  Scheduled to see  on  @ . Specialist Hospital f/u appt confirmed? Yes  Scheduled to see  on  @ . Are transportation arrangements needed? No  If their condition worsens, is the pt aware to call PCP or go to the Emergency Dept.? Yes Was the patient provided with contact information for the PCP's office or ED? Yes Was to pt encouraged to call back with questions or concerns? Yes

## 2023-04-24 ENCOUNTER — Other Ambulatory Visit: Payer: Self-pay

## 2023-04-24 ENCOUNTER — Telehealth: Payer: Self-pay | Admitting: Internal Medicine

## 2023-04-24 MED ORDER — AMLODIPINE BESYLATE 10 MG PO TABS: 10.0000 mg | ORAL_TABLET | Freq: Every day | ORAL | 5 refills | Status: DC

## 2023-04-24 MED ORDER — VALSARTAN 160 MG PO TABS: 160.0000 mg | ORAL_TABLET | Freq: Every day | ORAL | 5 refills | Status: DC

## 2023-04-24 NOTE — Telephone Encounter (Signed)
   Prescription Request  04/24/2023  LOV: 03/28/2023  What is the name of the medication or equipment? valsartan (DIOVAN) 160 MG tablet [[id number]]   amLODipine (NORVASC) 10 MG tablet [[id number]]  Have you contacted your pharmacy to request a refill? No   Which pharmacy would you like this sent to?  [facility] [place], [place] - [address] [place] [address] Phone: [phone] Fax: [phone]  ------------------------------------------------------ Pt wants to know if these pills okay to take twice still or once like prescription says? Also this pt need refills on both listed. Please advise.Marland Kitchen

## 2023-04-24 NOTE — Telephone Encounter (Signed)
Scripts sent in

## 2023-04-24 NOTE — Telephone Encounter (Signed)
Spoke with patient and blood pressure this week has been stable on 160 of Valsartan and 10 mg of the amlopodine.  Reading today was 120/80 highest has been 130/80.

## 2023-04-24 NOTE — Telephone Encounter (Signed)
BP sounds good - continue current meds.  I stopped the water pill because her calcium level was high and that can cause high calcium.

## 2023-04-25 ENCOUNTER — Other Ambulatory Visit: Payer: Self-pay

## 2023-04-25 MED ORDER — METFORMIN HCL ER 500 MG PO TB24: 500.0000 mg | ORAL_TABLET | Freq: Every day | ORAL | 1 refills | Status: DC

## 2023-04-25 MED ORDER — ROSUVASTATIN CALCIUM 10 MG PO TABS: 10.0000 mg | ORAL_TABLET | Freq: Every day | ORAL | 3 refills | Status: DC

## 2023-04-25 NOTE — Telephone Encounter (Signed)
Spoke with patient today. 

## 2023-04-25 NOTE — Telephone Encounter (Signed)
Message left for patient to return call to clinic.

## 2023-05-04 ENCOUNTER — Ambulatory Visit (HOSPITAL_COMMUNITY)
Admission: RE | Admit: 2023-05-04 | Discharge: 2023-05-04 | Disposition: A | Discharge status: Home / Self Care | Payer: PPO | Source: Ambulatory Visit | Attending: Internal Medicine | Admitting: Internal Medicine

## 2023-05-04 DIAGNOSIS — Z136 Encounter for screening for cardiovascular disorders: Secondary | ICD-10-CM | POA: Insufficient documentation

## 2023-05-12 ENCOUNTER — Encounter: Payer: Self-pay | Admitting: Internal Medicine

## 2023-05-12 DIAGNOSIS — I700 Atherosclerosis of aorta: Secondary | ICD-10-CM | POA: Insufficient documentation

## 2023-05-18 ENCOUNTER — Ambulatory Visit (INDEPENDENT_AMBULATORY_CARE_PROVIDER_SITE_OTHER): Payer: PPO

## 2023-05-18 VITALS — Ht 60.0 in | Wt 146.0 lb

## 2023-05-18 DIAGNOSIS — Z0000 Encounter for general adult medical examination without abnormal findings: Secondary | ICD-10-CM | POA: Diagnosis not present

## 2023-05-18 NOTE — Progress Notes (Signed)
Subjective:   Marcia Garcia is a 67 y.o. female who presents for Medicare Annual (Subsequent) preventive examination.  Visit Complete: Virtual I connected with  Jac Canavan on 05/18/23 by a audio enabled telemedicine application and verified that I am speaking with the correct person using two identifiers.  Patient Location: Home  Provider Location: Office/Clinic  I discussed the limitations of evaluation and management by telemedicine. The patient expressed understanding and agreed to proceed.  Vital Signs: Because this visit was a virtual/telehealth visit, some criteria may be missing or patient reported. Any vitals not documented were not able to be obtained and vitals that have been documented are patient reported.   Cardiac Risk Factors include: advanced age (>74men, >20 women);hypertension;diabetes mellitus;dyslipidemia;Other (see comment), Risk factor comments: Atherosclerosis of aorta, osteopenia     Objective:    Today's Vitals   05/18/23 1023  Weight: 146 lb (66.2 kg)  Height: 5' (1.524 m)   Body mass index is 28.51 kg/m.     05/18/2023   10:36 AM 03/11/2023   10:13 PM 05/16/2022    1:39 PM  Advanced Directives  Does Patient Have a Medical Advance Directive? No No No  Would patient like information on creating a medical advance directive?  No - Patient declined No - Patient declined    Current Medications (verified) Outpatient Encounter Medications as of 05/18/2023  Medication Sig   amLODipine (NORVASC) 10 MG tablet Take 1 tablet (10 mg total) by mouth daily.   aspirin EC 81 MG tablet Take 1 tablet by mouth daily.   blood glucose meter kit and supplies KIT One touch glucometer.  Use daily and as needed to check sugars as directed.  (FOR E11.9).   Cholecalciferol (VITAMIN D) 50 MCG (2000 UT) CAPS Take by mouth.   docusate sodium (COLACE) 50 MG capsule Take 50 mg by mouth 2 (two) times daily.   metFORMIN (GLUCOPHAGE-XR) 500 MG 24 hr tablet Take 1 tablet  (500 mg total) by mouth daily with breakfast.   Multiple Vitamin (MULTIVITAMIN) tablet Take 1 tablet by mouth daily. Does not have calcium   rosuvastatin (CRESTOR) 10 MG tablet Take 1 tablet (10 mg total) by mouth daily.   valsartan (DIOVAN) 160 MG tablet Take 1 tablet (160 mg total) by mouth daily.   No facility-administered encounter medications on file as of 05/18/2023.    Allergies (verified) Atorvastatin and Lisinopril   History: Past Medical History:  Diagnosis Date   Anemia    Diabetes mellitus without complication (HCC)    Hyperlipidemia    Hypertension    Past Surgical History:  Procedure Laterality Date   PARTIAL HYSTERECTOMY     Family History  Problem Relation Age of Onset   Diabetes Mother    Diabetes Maternal Grandmother    Alzheimer's disease Maternal Grandmother    Colon cancer Neg Hx    Esophageal cancer Neg Hx    Rectal cancer Neg Hx    Stomach cancer Neg Hx    Hypercalcemia Neg Hx    Social History   Socioeconomic History   Marital status: Widowed    Spouse name: Not on file   Number of children: 4   Years of education: Not on file   Highest education level: Not on file  Occupational History   Occupation: Prep for [employer]  Tobacco Use   Smoking status: Never   Smokeless tobacco: Never  Vaping Use   Vaping status: Never Used  Substance and Sexual Activity  Alcohol use: Never   Drug use: Not Currently   Sexual activity: Not Currently  Other Topics Concern   Not on file  Social History Narrative   Lives alone   Social Determinants of Health   Financial Resource Strain: Low Risk  (05/18/2023)   Overall Financial Resource Strain (CARDIA)    Difficulty of Paying Living Expenses: Not hard at all  Food Insecurity: No Food Insecurity (05/18/2023)   Hunger Vital Sign    Worried About Running Out of Food in the Last Year: Never true    Ran Out of Food in the Last Year: Never true  Transportation Needs: No Transportation Needs (05/18/2023)    PRAPARE - Administrator, Civil Service (Medical): No    Lack of Transportation (Non-Medical): No  Physical Activity: Insufficiently Active (05/18/2023)   Exercise Vital Sign    Days of Exercise per Week: 4 days    Minutes of Exercise per Session: 30 min  Stress: No Stress Concern Present (05/18/2023)   [employer] of Occupational Health - Occupational Stress Questionnaire    Feeling of Stress : Not at all  Social Connections: Moderately Isolated (05/18/2023)   Social Connection and Isolation Panel [NHANES]    Frequency of Communication with Friends and Family: More than three times a week    Frequency of Social Gatherings with Friends and Family: Once a week    Attends Religious Services: More than 4 times per year    Active Member of [employer] or Organizations: No    Attends Banker Meetings: Never    Marital Status: Widowed    Tobacco Counseling Counseling given: Not Answered   Clinical Intake:  Pre-visit preparation completed: Yes  Pain : No/denies pain     BMI - recorded: 28.51 Nutritional Status: BMI 25 -29 Overweight Nutritional Risks: None Diabetes: Yes CBG done?: No Did pt. bring in CBG monitor from home?: No  How often do you need to have someone help you when you read instructions, pamphlets, or other written materials from your doctor or pharmacy?: 1 - Never  Interpreter Needed?: No  Information entered by :: Hilda Wexler, RMA   Activities of Daily Living    05/18/2023   10:23 AM  In your present state of health, do you have any difficulty performing the following activities:  Hearing? 0  Vision? 0  Difficulty concentrating or making decisions? 0  Walking or climbing stairs? 0  Dressing or bathing? 0  Doing errands, shopping? 0  Preparing Food and eating ? N  Using the Toilet? N  In the past six months, have you accidently leaked urine? N  Do you have problems with loss of bowel control? N  Managing your Medications? N   Managing your Finances? N  Housekeeping or managing your Housekeeping? N    Patient Care Team: Pincus Sanes, MD as PCP - General (Internal Medicine) [facility] as Consulting Physician (Optometry)  Indicate any recent Medical Services you may have received from other than [facility] providers in the past year (date may be approximate).     Assessment:   This is a routine wellness examination for Santanna.  Hearing/Vision screen Hearing Screening - Comments:: Denies hearing difficulties   Vision Screening - Comments:: Wears eyeglasses   Goals Addressed               This Visit's Progress     Patient Stated (pt-stated)        Keep A1C under 7.  Work on  her weight.       Depression Screen    05/18/2023   10:44 AM 01/26/2023   10:09 AM 07/28/2022   10:11 AM 05/16/2022    1:44 PM 01/10/2022   11:13 AM 12/09/2020    9:35 AM 07/29/2019   10:44 AM  PHQ 2/9 Scores  PHQ - 2 Score 0 0 0 0 0 0 0  PHQ- 9 Score 0 0 0  0      Fall Risk    05/18/2023   10:36 AM 01/26/2023   10:09 AM 07/28/2022   10:11 AM 05/16/2022    1:40 PM 01/10/2022   11:13 AM  Fall Risk   Falls in the past year? 0 0 0 0 0  Number falls in past yr: 0 0 0 0 0  Injury with Fall? 0 0 0 0 0  Risk for fall due to : No Fall Risks No Fall Risks No Fall Risks No Fall Risks No Fall Risks  Follow up Falls prevention discussed;Falls evaluation completed Falls evaluation completed Falls evaluation completed Falls prevention discussed Falls evaluation completed    MEDICARE RISK AT HOME: Medicare Risk at Home Any stairs in or around the home?: Yes (4 steps) If so, are there any without handrails?: Yes Home free of loose throw rugs in walkways, pet beds, electrical cords, etc?: Yes Adequate lighting in your home to reduce risk of falls?: Yes Life alert?: No Use of a cane, walker or w/c?: No Grab bars in the bathroom?: Yes Shower chair or bench in shower?: No Elevated toilet seat or a handicapped toilet?: No  TIMED  UP AND GO:  Was the test performed?  No    Cognitive Function:        05/18/2023   10:24 AM 05/16/2022    1:59 PM  6CIT Screen  What Year? 0 points 0 points  What month? 0 points 0 points  What time? 0 points 0 points  Count back from 20 0 points 0 points  Months in reverse 0 points 0 points  Repeat phrase 0 points 0 points  Total Score 0 points 0 points    Immunizations Immunization History  Administered Date(s) Administered   Moderna Sars-Covid-2 Vaccination 08/08/2019, 09/05/2019, 05/14/2020, 10/29/2020, 04/15/2023   PPD Test 12/04/2019   Tdap 07/29/2012   Zoster Recombinant(Shingrix) 04/28/2023    TDAP status: Due, Education has been provided regarding the importance of this vaccine. Advised may receive this vaccine at local pharmacy or Health Dept. Aware to provide a copy of the vaccination record if obtained from local pharmacy or Health Dept. Verbalized acceptance and understanding.  Flu Vaccine status: Declined, Education has been provided regarding the importance of this vaccine but patient still declined. Advised may receive this vaccine at local pharmacy or Health Dept. Aware to provide a copy of the vaccination record if obtained from local pharmacy or Health Dept. Verbalized acceptance and understanding.  Pneumococcal vaccine status: Declined,  Education has been provided regarding the importance of this vaccine but patient still declined. Advised may receive this vaccine at local pharmacy or Health Dept. Aware to provide a copy of the vaccination record if obtained from local pharmacy or Health Dept. Verbalized acceptance and understanding.   Covid-19 vaccine status: Completed vaccines  Qualifies for Shingles Vaccine? Yes   Zostavax completed Yes   Shingrix Completed?: No.    Education has been provided regarding the importance of this vaccine. Patient has been advised to call insurance company to determine out of pocket expense  if they have not yet received this  vaccine. Advised may also receive vaccine at local pharmacy or Health Dept. Verbalized acceptance and understanding.  Screening Tests Health Maintenance  Topic Date Due   MAMMOGRAM  05/22/2023 (Originally 01/27/2023)   FOOT EXAM  05/30/2023 (Originally 12/09/2021)   DTaP/Tdap/Td (2 - Td or Tdap) 08/10/2023 (Originally 07/29/2022)   OPHTHALMOLOGY EXAM  08/10/2023 (Originally 05/28/1966)   INFLUENZA VACCINE  09/10/2023 (Originally 01/11/2023)   Pneumonia Vaccine 41+ Years old (1 of 1 - PCV) 05/17/2024 (Originally 05/28/2021)   COVID-19 Vaccine (6 - 2023-24 season) 06/10/2023   Zoster Vaccines- Shingrix (2 of 2) 06/23/2023   Diabetic kidney evaluation - Urine ACR  07/29/2023   HEMOGLOBIN A1C  07/29/2023   Diabetic kidney evaluation - eGFR measurement  03/27/2024   DEXA SCAN  03/28/2024   Medicare Annual Wellness (AWV)  05/17/2024   Colonoscopy  04/15/2028   HPV VACCINES  Aged Out   Hepatitis C Screening  Discontinued    Health Maintenance  There are no preventive care reminders to display for this patient.   Colorectal cancer screening: Type of screening: Colonoscopy. Completed 04/15/2018. Repeat every 10 years  Mammogram status: Completed 01/26/2021. Repeat every year 05/22/2023  Bone Density status: Completed 04/01/2021. Results reflect: Bone density results: OSTEOPENIA. Repeat every 2 years.  Lung Cancer Screening: (Low Dose CT Chest recommended if Age 80-80 years, 20 pack-year currently smoking OR have quit w/in 15years.) does not qualify.   Lung Cancer Screening Referral: N/A  Additional Screening:  Hepatitis C Screening: does qualify;   Vision Screening: Recommended annual ophthalmology exams for early detection of glaucoma and other disorders of the eye. Is the patient up to date with their annual eye exam?  Yes  Who is the provider or what is the name of the office in which the patient attends annual eye exams? Dr. Laural Benes [facility] in [place], [place] If pt is not  established with a provider, would they like to be referred to a provider to establish care? No .   Dental Screening: Recommended annual dental exams for proper oral hygiene  Diabetic Foot Exam: Diabetic Foot Exam: Overdue, Pt has been advised about the importance in completing this exam. Pt is scheduled for diabetic foot exam on 05/30/2023.  Community Resource Referral / Chronic Care Management: CRR required this visit?  No   CCM required this visit?  No     Plan:     I have personally reviewed and noted the following in the patient's chart:   Medical and social history Use of alcohol, tobacco or illicit drugs  Current medications and supplements including opioid prescriptions. Patient is not currently taking opioid prescriptions. Functional ability and status Nutritional status Physical activity Advanced directives List of other physicians Hospitalizations, surgeries, and ER visits in previous 12 months Vitals Screenings to include cognitive, depression, and falls Referrals and appointments  In addition, I have reviewed and discussed with patient certain preventive protocols, quality metrics, and best practice recommendations. A written personalized care plan for preventive services as well as general preventive health recommendations were provided to patient.     Consuella Scurlock L Bradden Tadros, CMA   05/18/2023   After Visit Summary: (Mail) Due to this being a telephonic visit, the after visit summary with patients personalized plan was offered to patient via mail   Nurse Notes: Patient is due for a Tdap. And also her 2nd Shingrix vaccine. She declines the Flu and Pneumonia vaccines at this time.  Patient stated that  she has had her eye examination recently, which I have sent a request for records to her eye doctors office.  Patient is also due for a foot exam, which she will get done during her up coming visit in February with Dr. Lawerance Bach.

## 2023-05-18 NOTE — Patient Instructions (Signed)
Marcia Garcia , Thank you for taking time to come for your Medicare Wellness Visit. I appreciate your ongoing commitment to your health goals. Please review the following plan we discussed and let me know if I can assist you in the future.   Referrals/Orders/Follow-Ups/Clinician Recommendations: You are due for a Tetanus vaccine.  Remember to get your foot exam during your up coming visit with Dr. Lawerance Bach.  Keep up the good work and [name].   This is a list of the screening recommended for you and due dates:  Health Maintenance  Topic Date Due   Mammogram  05/22/2023*   Complete foot exam   05/30/2023*   DTaP/Tdap/Td vaccine (2 - Td or Tdap) 08/10/2023*   Eye exam for diabetics  08/10/2023*   Flu Shot  09/10/2023*   Pneumonia Vaccine (1 of 1 - PCV) 05/17/2024*   COVID-19 Vaccine (6 - 2023-24 season) 06/10/2023   Zoster (Shingles) Vaccine (2 of 2) 06/23/2023   Yearly kidney health urinalysis for diabetes  07/29/2023   Hemoglobin A1C  07/29/2023   Yearly kidney function blood test for diabetes  03/27/2024   DEXA scan (bone density measurement)  03/28/2024   Medicare Annual Wellness Visit  05/17/2024   Colon Cancer Screening  04/15/2028   HPV Vaccine  Aged Out   Hepatitis C Screening  Discontinued  *Topic was postponed. The date shown is not the original due date.    Advanced directives: (Declined) Advance directive discussed with you today. Even though you declined this today, please call our office should you change your mind, and we can give you the proper paperwork for you to fill out.  Next Medicare Annual Wellness Visit scheduled for next year: Yes

## 2023-05-22 DIAGNOSIS — Z1231 Encounter for screening mammogram for malignant neoplasm of breast: Secondary | ICD-10-CM | POA: Diagnosis not present

## 2023-05-22 LAB — HM MAMMOGRAPHY

## 2023-06-26 DIAGNOSIS — N951 Menopausal and female climacteric states: Secondary | ICD-10-CM | POA: Diagnosis not present

## 2023-07-18 ENCOUNTER — Telehealth: Payer: Self-pay | Admitting: Internal Medicine

## 2023-07-18 NOTE — Telephone Encounter (Signed)
 Routing to clinical staff/provider for review.  Copied from CRM [id number]. Topic: Clinical - Prescription Issue >> Jul 18, 2023  4:22 PM Leotis ORN wrote: Reason for CRM: metFORMIN  (GLUCOPHAGE -XR) 500 MG 24 hr tablet [[id number]]   patient stated the [facility] only gave her enough tablets for 1 per day, she is supposed to take 2 tablets per day, patient is requesting a new script sent in with the correct dosage and quantity

## 2023-07-20 ENCOUNTER — Other Ambulatory Visit: Payer: Self-pay

## 2023-07-20 MED ORDER — METFORMIN HCL ER 500 MG PO TB24: 500.0000 mg | ORAL_TABLET | Freq: Two times a day (BID) | ORAL | 1 refills | Status: DC

## 2023-07-20 NOTE — Telephone Encounter (Signed)
 Spoke with patient today.

## 2023-07-20 NOTE — Telephone Encounter (Signed)
 Copied from CRM [id number]. Topic: Clinical - Prescription Issue >> Jul 20, 2023  1:47 PM Joanell B wrote: Pt is calling again and stating that the [facility] pharmacy only gave her enough tablets for 1 per day, she is supposed to take 2 tablets per day, patient is requesting a new script sent in with the correct dosage and quantity. Medication is metFORMIN  (GLUCOPHAGE -XR) 500 MG 24 hr tablet

## 2023-07-20 NOTE — Telephone Encounter (Signed)
 Ok to give 2 tabs a day - she is coming in soon and we can evaluate

## 2023-07-20 NOTE — Telephone Encounter (Signed)
 New script sent in today for patient.

## 2023-07-31 ENCOUNTER — Ambulatory Visit: Payer: PPO | Admitting: Internal Medicine

## 2023-08-02 ENCOUNTER — Ambulatory Visit: Payer: PPO | Admitting: Internal Medicine

## 2023-08-15 NOTE — Patient Instructions (Addendum)
      Blood work was ordered.       Medications changes include :   None    A referral was ordered and someone will call you to schedule an appointment.     Return in about 6 months (around 02/16/2024) for Physical Exam.

## 2023-08-15 NOTE — Progress Notes (Unsigned)
 Subjective:    Patient ID: Marcia Garcia, female    DOB: Sep 13, 1955, 68 y.o.   MRN: [id number]     HPI Shawanna is here for follow up of her chronic medical problems.  No changes.  No concerns.    Medications and allergies reviewed with patient and updated if appropriate.  Current Outpatient Medications on File Prior to Visit  Medication Sig Dispense Refill   amLODipine (NORVASC) 10 MG tablet Take 1 tablet (10 mg total) by mouth daily. 30 tablet 5   aspirin EC 81 MG tablet Take 1 tablet by mouth daily.     blood glucose meter kit and supplies KIT One touch glucometer.  Use daily and as needed to check sugars as directed.  (FOR E11.9). 1 each 0   Cholecalciferol (VITAMIN D) 50 MCG (2000 UT) CAPS Take by mouth.     docusate sodium (COLACE) 50 MG capsule Take 50 mg by mouth 2 (two) times daily.     Multiple Vitamin (MULTIVITAMIN) tablet Take 1 tablet by mouth daily. Does not have calcium     rosuvastatin (CRESTOR) 10 MG tablet Take 1 tablet (10 mg total) by mouth daily. 90 tablet 3   valsartan (DIOVAN) 160 MG tablet Take 1 tablet (160 mg total) by mouth daily. 30 tablet 5   No current facility-administered medications on file prior to visit.     Review of Systems  Constitutional:  Negative for fever.  Respiratory:  Negative for cough, shortness of breath and wheezing.   Cardiovascular:  Negative for chest pain, palpitations and leg swelling.  Neurological:  Negative for light-headedness and headaches.       Objective:   Vitals:   08/16/23 1046  BP: 124/80  Pulse: 63  Temp: 98.2 F (36.8 C)  SpO2: 96%   BP Readings from Last 3 Encounters:  08/16/23 124/80  03/28/23 (!) 148/86  03/14/23 (!) 168/84   Wt Readings from Last 3 Encounters:  08/16/23 150 lb 12.8 oz (68.4 kg)  05/18/23 146 lb (66.2 kg)  03/28/23 146 lb (66.2 kg)   Body mass index is 29.45 kg/m.    Physical Exam Constitutional:      General: She is not in acute distress.    Appearance:  Normal appearance.  HENT:     Head: Normocephalic and atraumatic.  Eyes:     Conjunctiva/sclera: Conjunctivae normal.  Cardiovascular:     Rate and Rhythm: Normal rate and regular rhythm.     Heart sounds: Normal heart sounds.  Pulmonary:     Effort: Pulmonary effort is normal. No respiratory distress.     Breath sounds: Normal breath sounds. No wheezing.  Musculoskeletal:     Cervical back: Neck supple.     Right lower leg: No edema.     Left lower leg: No edema.  Lymphadenopathy:     Cervical: No cervical adenopathy.  Skin:    General: Skin is warm and dry.     Findings: No rash.  Neurological:     Mental Status: She is alert. Mental status is at baseline.  Psychiatric:        Mood and Affect: Mood normal.        Behavior: Behavior normal.        Lab Results  Component Value Date   WBC 6.8 03/11/2023   HGB 10.5 (L) 03/11/2023   HCT 35.4 (L) 03/11/2023   PLT 332 03/11/2023   GLUCOSE 108 (H) 03/28/2023   CHOL 145 01/26/2023  TRIG 65.0 01/26/2023   HDL 53.60 01/26/2023   LDLCALC 78 01/26/2023   ALT 19 01/26/2023   AST 22 01/26/2023   NA 138 03/28/2023   K 4.4 03/28/2023   CL 103 03/28/2023   CREATININE 0.67 03/28/2023   BUN 12 03/28/2023   CO2 30 03/28/2023   TSH 2.73 07/28/2022   HGBA1C 6.1 01/26/2023   MICROALBUR 3.3 (H) 07/28/2022     Assessment & Plan:    See Problem List for Assessment and Plan of chronic medical problems.

## 2023-08-16 ENCOUNTER — Telehealth: Payer: Self-pay | Admitting: Internal Medicine

## 2023-08-16 ENCOUNTER — Ambulatory Visit (INDEPENDENT_AMBULATORY_CARE_PROVIDER_SITE_OTHER): Payer: PPO | Admitting: Internal Medicine

## 2023-08-16 ENCOUNTER — Encounter: Payer: Self-pay | Admitting: Internal Medicine

## 2023-08-16 VITALS — BP 124/80 | HR 63 | Temp 98.2°F | Ht 60.0 in | Wt 150.8 lb

## 2023-08-16 DIAGNOSIS — M85852 Other specified disorders of bone density and structure, left thigh: Secondary | ICD-10-CM

## 2023-08-16 DIAGNOSIS — D75839 Thrombocytosis, unspecified: Secondary | ICD-10-CM | POA: Diagnosis not present

## 2023-08-16 DIAGNOSIS — I10 Essential (primary) hypertension: Secondary | ICD-10-CM

## 2023-08-16 DIAGNOSIS — E1169 Type 2 diabetes mellitus with other specified complication: Secondary | ICD-10-CM | POA: Diagnosis not present

## 2023-08-16 DIAGNOSIS — M85851 Other specified disorders of bone density and structure, right thigh: Secondary | ICD-10-CM

## 2023-08-16 DIAGNOSIS — E782 Mixed hyperlipidemia: Secondary | ICD-10-CM | POA: Diagnosis not present

## 2023-08-16 DIAGNOSIS — I7121 Aneurysm of the ascending aorta, without rupture: Secondary | ICD-10-CM | POA: Insufficient documentation

## 2023-08-16 DIAGNOSIS — D649 Anemia, unspecified: Secondary | ICD-10-CM | POA: Diagnosis not present

## 2023-08-16 LAB — CBC WITH DIFFERENTIAL/PLATELET
Basophils Absolute: 0 10*3/uL (ref 0.0–0.1)
Basophils Relative: 0.3 % (ref 0.0–3.0)
Eosinophils Absolute: 0.1 10*3/uL (ref 0.0–0.7)
Eosinophils Relative: 2.1 % (ref 0.0–5.0)
HCT: 37.5 % (ref 36.0–46.0)
Hemoglobin: 11.6 g/dL — ABNORMAL LOW (ref 12.0–15.0)
Lymphocytes Relative: 39.6 % (ref 12.0–46.0)
Lymphs Abs: 2.3 10*3/uL (ref 0.7–4.0)
MCHC: 31 g/dL (ref 30.0–36.0)
MCV: 70.1 fl — ABNORMAL LOW (ref 78.0–100.0)
Monocytes Absolute: 0.4 10*3/uL (ref 0.1–1.0)
Monocytes Relative: 6.3 % (ref 3.0–12.0)
Neutro Abs: 3.1 10*3/uL (ref 1.4–7.7)
Neutrophils Relative %: 51.7 % (ref 43.0–77.0)
Platelets: 395 10*3/uL (ref 150.0–400.0)
RBC: 5.35 Mil/uL — ABNORMAL HIGH (ref 3.87–5.11)
RDW: 18.2 % — ABNORMAL HIGH (ref 11.5–15.5)
WBC: 5.9 10*3/uL (ref 4.0–10.5)

## 2023-08-16 LAB — HEMOGLOBIN A1C: Hgb A1c MFr Bld: 6.8 % — ABNORMAL HIGH (ref 4.6–6.5)

## 2023-08-16 LAB — COMPREHENSIVE METABOLIC PANEL
ALT: 20 U/L (ref 0–35)
AST: 23 U/L (ref 0–37)
Albumin: 4.8 g/dL (ref 3.5–5.2)
Alkaline Phosphatase: 66 U/L (ref 39–117)
BUN: 12 mg/dL (ref 6–23)
CO2: 28 meq/L (ref 19–32)
Calcium: 10.9 mg/dL — ABNORMAL HIGH (ref 8.4–10.5)
Chloride: 102 meq/L (ref 96–112)
Creatinine, Ser: 0.6 mg/dL (ref 0.40–1.20)
GFR: 93.01 mL/min (ref >60.00)
Glucose, Bld: 102 mg/dL — ABNORMAL HIGH (ref 70–99)
Potassium: 3.9 meq/L (ref 3.5–5.1)
Sodium: 136 meq/L (ref 135–145)
Total Bilirubin: 0.7 mg/dL (ref 0.2–1.2)
Total Protein: 8.8 g/dL — ABNORMAL HIGH (ref 6.0–8.3)

## 2023-08-16 LAB — LIPID PANEL
Cholesterol: 145 mg/dL (ref 0–200)
HDL: 61.9 mg/dL (ref >39.00)
LDL Cholesterol: 69 mg/dL (ref 0–99)
NonHDL: 83.4
Total CHOL/HDL Ratio: 2
Triglycerides: 72 mg/dL (ref 0.0–149.0)
VLDL: 14.4 mg/dL (ref 0.0–40.0)

## 2023-08-16 LAB — MICROALBUMIN / CREATININE URINE RATIO
Creatinine,U: 94.8 mg/dL
Microalb Creat Ratio: 8.5 mg/g (ref 0.0–30.0)
Microalb, Ur: 0.8 mg/dL (ref 0.0–1.9)

## 2023-08-16 MED ORDER — METFORMIN HCL ER 500 MG PO TB24: 1000.0000 mg | ORAL_TABLET | Freq: Every day | ORAL | 1 refills | Status: DC

## 2023-08-16 NOTE — Assessment & Plan Note (Signed)
 Chronic Has been anemic her whole life Her mother and daughter are also anemic Check CBC, hemoglobin electrophoresis B12, iron and folate levels have been normal

## 2023-08-16 NOTE — Assessment & Plan Note (Signed)
 Chronic  Lab Results  Component Value Date   HGBA1C 6.1 01/26/2023   Sugars controlled Check A1c, urine albumin/creatinine ratio Continue metformin XR 1000 mg daily with breakfast Stressed regular exercise, diabetic diet

## 2023-08-16 NOTE — Assessment & Plan Note (Signed)
 Chronic Improved-last platelet level was normal CBC

## 2023-08-16 NOTE — Assessment & Plan Note (Signed)
 Chronic Goal BP < 130/80 Blood pressure controlled Continue valsartan 160 mg daily and amlodipine 10 mg daily CMP Monitor BP at home

## 2023-08-16 NOTE — Telephone Encounter (Signed)
 Pt missed her CPE for this year and wanted to schedule that in the time frame her PCP asked her to come back. Do you approve her getting seen or added to your schedule earlier that month you requested her to come back or Dec which is what you have available for a physical? Just asking for clarity due to schedule..  Please advise, Thanks

## 2023-08-16 NOTE — Assessment & Plan Note (Signed)
Chronic DEXA up-to-date She is very active and does a lot of walking at work Continue vitamin D daily, multivitamin

## 2023-08-16 NOTE — Assessment & Plan Note (Signed)
 Chronic Regular exercise and healthy diet encouraged Check lipid panel, CMP Continue Crestor 10 mg daily

## 2023-08-19 LAB — HGB FRACTIONATION CASCADE
Hgb A2: 2.5 % (ref 1.8–3.2)
Hgb A: 97.5 % (ref 96.4–98.8)
Hgb F: 0 % (ref 0.0–2.0)
Hgb S: 0 %

## 2023-08-20 NOTE — Telephone Encounter (Signed)
Ok to schedule whenever.

## 2023-09-20 ENCOUNTER — Other Ambulatory Visit: Payer: Self-pay | Admitting: Internal Medicine

## 2024-02-17 ENCOUNTER — Encounter: Payer: Self-pay | Admitting: Internal Medicine

## 2024-02-17 NOTE — Progress Notes (Unsigned)
 Subjective:    Patient ID: Marcia Garcia, female    DOB: 1956-01-16, 68 y.o.   MRN: [id number]      HPI Marcia Garcia is here for a Physical exam and her chronic medical problems.   She has been having pain in her right groin area when she gets up from a chair.  It feels like it pulls towards the right because it does not want to go left.  Pain radiates toward the mid upper leg. She does have pain with turning in bed. No pain with walking, laying or sitting.  No pain with lifting, coughing, sneezing.  No lumps. This started at least 6 months ago.  It has gotten worse.    [facility] at [facility] - [facility], 128/7?   At home   Medications and allergies reviewed with patient and updated if appropriate.  Current Outpatient Medications on File Prior to Visit  Medication Sig Dispense Refill   amLODipine  (NORVASC ) 10 MG tablet TAKE 1 TABLET BY MOUTH EVERY DAY 90 tablet 1   aspirin EC 81 MG tablet Take 1 tablet by mouth daily.     blood glucose meter kit and supplies KIT One touch glucometer.  Use daily and as needed to check sugars as directed.  (FOR E11.9). 1 each 0   Cholecalciferol (VITAMIN D ) 50 MCG (2000 UT) CAPS Take by mouth.     docusate sodium (COLACE) 50 MG capsule Take 50 mg by mouth 2 (two) times daily.     metFORMIN  (GLUCOPHAGE -XR) 500 MG 24 hr tablet Take 2 tablets (1,000 mg total) by mouth daily with breakfast. 180 tablet 1   rosuvastatin  (CRESTOR ) 10 MG tablet Take 1 tablet (10 mg total) by mouth daily. 90 tablet 3   valsartan  (DIOVAN ) 160 MG tablet TAKE 1 TABLET BY MOUTH EVERY DAY 90 tablet 1   Acetaminophen (TYLENOL PO)      No current facility-administered medications on file prior to visit.    Review of Systems  Constitutional:  Negative for fever.  Eyes:  Negative for visual disturbance.  Respiratory:  Negative for cough, shortness of breath and wheezing.   Cardiovascular:  Negative for chest pain, palpitations and leg swelling.  Gastrointestinal:  Negative for  abdominal pain, blood in stool, constipation and diarrhea.       No gerd  Genitourinary:  Negative for dysuria.  Musculoskeletal:  Positive for arthralgias (mild). Negative for back pain.  Skin:  Negative for rash.  Neurological:  Negative for dizziness, light-headedness and headaches.  Psychiatric/Behavioral:  Negative for dysphoric mood and sleep disturbance. The patient is not nervous/anxious.        Objective:   Vitals:   02/19/24 0934 02/19/24 1022  BP: 138/78 122/78  Pulse: 60   Temp: 98 F (36.7 C)   SpO2: 99%    Filed Weights   02/19/24 0934  Weight: 143 lb (64.9 kg)   Body mass index is 27.93 kg/m.  BP Readings from Last 3 Encounters:  02/19/24 122/78  08/16/23 124/80  03/28/23 (!) 148/86    Wt Readings from Last 3 Encounters:  02/19/24 143 lb (64.9 kg)  08/16/23 150 lb 12.8 oz (68.4 kg)  05/18/23 146 lb (66.2 kg)       Physical Exam Constitutional: She appears well-developed and well-nourished. No distress.  HENT:  Head: Normocephalic and atraumatic.  Right Ear: External ear normal. Normal ear canal and TM Left Ear: External ear normal.  Normal ear canal and TM Mouth/Throat: Oropharynx is clear  and moist.  Eyes: Conjunctivae normal.  Neck: Neck supple. No tracheal deviation present. No thyromegaly present.  No carotid bruit  Cardiovascular: Normal rate, regular rhythm and normal heart sounds.   No murmur heard.  No edema. Pulmonary/Chest: Effort normal and breath sounds normal. No respiratory distress. She has no wheezes. She has no rales.  Breast: deferred   Abdominal: Soft. She exhibits no distension. There is no tenderness.  Lymphadenopathy: She has no cervical adenopathy.  Skin: Skin is warm and dry. She is not diaphoretic.  Psychiatric: She has a normal mood and affect. Her behavior is normal.   Diabetic Foot Exam - Simple   Simple Foot Form Diabetic Foot exam was performed with the following findings: Yes 02/19/2024 10:15 AM  Visual  Inspection No deformities, no ulcerations, no other skin breakdown bilaterally: Yes Sensation Testing Intact to touch and monofilament testing bilaterally: Yes Pulse Check Posterior Tibialis and Dorsalis pulse intact bilaterally: Yes Comments      Lab Results  Component Value Date   WBC 5.9 08/16/2023   HGB 11.6 (L) 08/16/2023   HCT 37.5 08/16/2023   PLT 395.0 08/16/2023   GLUCOSE 102 (H) 08/16/2023   CHOL 145 08/16/2023   TRIG 72.0 08/16/2023   HDL 61.90 08/16/2023   LDLCALC 69 08/16/2023   ALT 20 08/16/2023   AST 23 08/16/2023   NA 136 08/16/2023   K 3.9 08/16/2023   CL 102 08/16/2023   CREATININE 0.60 08/16/2023   BUN 12 08/16/2023   CO2 28 08/16/2023   TSH 2.73 07/28/2022   HGBA1C 6.8 (H) 08/16/2023   MICROALBUR 0.8 08/16/2023         Assessment & Plan:   Physical exam: Screening blood work  ordered Exercise  active at work - no regular exercise - stressed regular exercise Weight  is good Substance abuse  none   Reviewed recommended immunizations.   Health Maintenance  Topic Date Due   DTaP/Tdap/Td (2 - Td or Tdap) 07/29/2022   OPHTHALMOLOGY EXAM  12/28/2023   COVID-19 Vaccine (6 - 2025-26 season) 02/11/2024   HEMOGLOBIN A1C  02/16/2024   Pneumococcal Vaccine: 50+ Years (1 of 2 - PCV) 05/17/2024 (Originally 05/29/1975)   Influenza Vaccine  09/09/2024 (Originally 01/11/2024)   DEXA SCAN  03/28/2024   Medicare Annual Wellness (AWV)  05/17/2024   Diabetic kidney evaluation - eGFR measurement  08/15/2024   Diabetic kidney evaluation - Urine ACR  08/15/2024   FOOT EXAM  02/18/2025   MAMMOGRAM  05/21/2025   Colonoscopy  04/15/2028   HPV VACCINES  Aged Out   Meningococcal B Vaccine  Aged Out   Hepatitis C Screening  Discontinued   Zoster Vaccines- Shingrix  Discontinued          See Problem List for Assessment and Plan of chronic medical problems.

## 2024-02-17 NOTE — Patient Instructions (Addendum)
 Blood work was ordered.       Medications changes include :   None    A referral was ordered for sports medicine and someone will call you to schedule an appointment.     Return in about 6 months (around 08/18/2024) for follow up, Schedule DEXA-[place].    Health Maintenance, Female Adopting a healthy lifestyle and getting preventive care are important in promoting health and wellness. Ask your health care provider about: The right schedule for you to have regular tests and exams. Things you can do on your own to prevent diseases and keep yourself healthy. What should I know about diet, weight, and exercise? Eat a healthy diet  Eat a diet that includes plenty of vegetables, fruits, low-fat dairy products, and lean protein. Do not eat a lot of foods that are high in solid fats, added sugars, or sodium. Maintain a healthy weight Body mass index (BMI) is used to identify weight problems. It estimates body fat based on height and weight. Your health care provider can help determine your BMI and help you achieve or maintain a healthy weight. Get regular exercise Get regular exercise. This is one of the most important things you can do for your health. Most adults should: Exercise for at least 150 minutes each week. The exercise should increase your heart rate and make you sweat (moderate-intensity exercise). Do strengthening exercises at least twice a week. This is in addition to the moderate-intensity exercise. Spend less time sitting. Even light physical activity can be beneficial. Watch cholesterol and blood lipids Have your blood tested for lipids and cholesterol at 68 years of age, then have this test every 5 years. Have your cholesterol levels checked more often if: Your lipid or cholesterol levels are high. You are older than 68 years of age. You are at high risk for heart disease. What should I know about cancer screening? Depending on your health history and family  history, you may need to have cancer screening at various ages. This may include screening for: Breast cancer. Cervical cancer. Colorectal cancer. Skin cancer. Lung cancer. What should I know about heart disease, diabetes, and high blood pressure? Blood pressure and heart disease High blood pressure causes heart disease and increases the risk of stroke. This is more likely to develop in people who have high blood pressure readings or are overweight. Have your blood pressure checked: Every 3-5 years if you are 52-54 years of age. Every year if you are 68 years old or older. Diabetes Have regular diabetes screenings. This checks your fasting blood sugar level. Have the screening done: Once every three years after age 30 if you are at a normal weight and have a low risk for diabetes. More often and at a younger age if you are overweight or have a high risk for diabetes. What should I know about preventing infection? Hepatitis B If you have a higher risk for hepatitis B, you should be screened for this virus. Talk with your health care provider to find out if you are at risk for hepatitis B infection. Hepatitis C Testing is recommended for: Everyone born from [birth year] through 1965. Anyone with known risk factors for hepatitis C. Sexually transmitted infections (STIs) Get screened for STIs, including gonorrhea and chlamydia, if: You are sexually active and are younger than 68 years of age. You are older than 68 years of age and your health care provider tells you that you are at risk for this type  of infection. Your sexual activity has changed since you were last screened, and you are at increased risk for chlamydia or gonorrhea. Ask your health care provider if you are at risk. Ask your health care provider about whether you are at high risk for HIV. Your health care provider may recommend a prescription medicine to help prevent HIV infection. If you choose to take medicine to prevent HIV, you  should first get tested for HIV. You should then be tested every 3 months for as long as you are taking the medicine. Pregnancy If you are about to stop having your period (premenopausal) and you may become pregnant, seek counseling before you get pregnant. Take 400 to 800 micrograms (mcg) of folic acid  every day if you become pregnant. Ask for birth control (contraception) if you want to prevent pregnancy. Osteoporosis and menopause Osteoporosis is a disease in which the bones lose minerals and strength with aging. This can result in bone fractures. If you are 20 years old or older, or if you are at risk for osteoporosis and fractures, ask your health care provider if you should: Be screened for bone loss. Take a calcium  or vitamin D  supplement to lower your risk of fractures. Be given hormone replacement therapy (HRT) to treat symptoms of menopause. Follow these instructions at home: Alcohol use Do not drink alcohol if: Your health care provider tells you not to drink. You are pregnant, may be pregnant, or are planning to become pregnant. If you drink alcohol: Limit how much you have to: 0-1 drink a day. Know how much alcohol is in your drink. In the [place] one drink equals one 12 oz bottle of beer (355 mL), one 5 oz glass of wine (148 mL), or one 1 oz glass of hard liquor (44 mL). Lifestyle Do not use any products that contain nicotine or tobacco. These products include cigarettes, chewing tobacco, and vaping devices, such as e-cigarettes. If you need help quitting, ask your health care provider. Do not use street drugs. Do not share needles. Ask your health care provider for help if you need support or information about quitting drugs. General instructions Schedule regular health, dental, and eye exams. Stay current with your vaccines. Tell your health care provider if: You often feel depressed. You have ever been abused or do not feel safe at home. Summary Adopting a healthy  lifestyle and getting preventive care are important in promoting health and wellness. Follow your health care provider's instructions about healthy diet, exercising, and getting tested or screened for diseases. Follow your health care provider's instructions on monitoring your cholesterol and blood pressure. This information is not intended to replace advice given to you by your health care provider. Make sure you discuss any questions you have with your health care provider. Document Revised: 10/18/2020 Document Reviewed: 10/18/2020 Elsevier Patient Education  2024 [employer].

## 2024-02-19 ENCOUNTER — Ambulatory Visit (INDEPENDENT_AMBULATORY_CARE_PROVIDER_SITE_OTHER): Admitting: Internal Medicine

## 2024-02-19 VITALS — BP 122/78 | HR 60 | Temp 98.0°F | Ht 60.0 in | Wt 143.0 lb

## 2024-02-19 DIAGNOSIS — I152 Hypertension secondary to endocrine disorders: Secondary | ICD-10-CM | POA: Diagnosis not present

## 2024-02-19 DIAGNOSIS — Z0000 Encounter for general adult medical examination without abnormal findings: Secondary | ICD-10-CM | POA: Diagnosis not present

## 2024-02-19 DIAGNOSIS — E1159 Type 2 diabetes mellitus with other circulatory complications: Secondary | ICD-10-CM

## 2024-02-19 DIAGNOSIS — Z7984 Long term (current) use of oral hypoglycemic drugs: Secondary | ICD-10-CM

## 2024-02-19 DIAGNOSIS — R1031 Right lower quadrant pain: Secondary | ICD-10-CM

## 2024-02-19 DIAGNOSIS — E2839 Other primary ovarian failure: Secondary | ICD-10-CM | POA: Diagnosis not present

## 2024-02-19 DIAGNOSIS — E1169 Type 2 diabetes mellitus with other specified complication: Secondary | ICD-10-CM

## 2024-02-19 DIAGNOSIS — E785 Hyperlipidemia, unspecified: Secondary | ICD-10-CM

## 2024-02-19 DIAGNOSIS — E210 Primary hyperparathyroidism: Secondary | ICD-10-CM

## 2024-02-19 DIAGNOSIS — M85851 Other specified disorders of bone density and structure, right thigh: Secondary | ICD-10-CM

## 2024-02-19 LAB — LIPID PANEL
Cholesterol: 158 mg/dL (ref 0–200)
HDL: 62.5 mg/dL (ref >39.00)
LDL Cholesterol: 77 mg/dL (ref 0–99)
NonHDL: 95.98
Total CHOL/HDL Ratio: 3
Triglycerides: 95 mg/dL (ref 0.0–149.0)
VLDL: 19 mg/dL (ref 0.0–40.0)

## 2024-02-19 LAB — COMPREHENSIVE METABOLIC PANEL WITH GFR
ALT: 23 U/L (ref 0–35)
AST: 26 U/L (ref 0–37)
Albumin: 4.8 g/dL (ref 3.5–5.2)
Alkaline Phosphatase: 59 U/L (ref 39–117)
BUN: 15 mg/dL (ref 6–23)
CO2: 28 meq/L (ref 19–32)
Calcium: 11.2 mg/dL — ABNORMAL HIGH (ref 8.4–10.5)
Chloride: 103 meq/L (ref 96–112)
Creatinine, Ser: 0.76 mg/dL (ref 0.40–1.20)
GFR: 80.91 mL/min (ref >60.00)
Glucose, Bld: 111 mg/dL — ABNORMAL HIGH (ref 70–99)
Potassium: 4.5 meq/L (ref 3.5–5.1)
Sodium: 137 meq/L (ref 135–145)
Total Bilirubin: 0.8 mg/dL (ref 0.2–1.2)
Total Protein: 8.7 g/dL — ABNORMAL HIGH (ref 6.0–8.3)

## 2024-02-19 LAB — CBC WITH DIFFERENTIAL/PLATELET
Basophils Absolute: 0 K/uL (ref 0.0–0.1)
Basophils Relative: 0.4 % (ref 0.0–3.0)
Eosinophils Absolute: 0.2 K/uL (ref 0.0–0.7)
Eosinophils Relative: 2.9 % (ref 0.0–5.0)
HCT: 34.9 % — ABNORMAL LOW (ref 36.0–46.0)
Hemoglobin: 10.9 g/dL — ABNORMAL LOW (ref 12.0–15.0)
Lymphocytes Relative: 33.8 % (ref 12.0–46.0)
Lymphs Abs: 2 K/uL (ref 0.7–4.0)
MCHC: 31.3 g/dL (ref 30.0–36.0)
MCV: 70.6 fl — ABNORMAL LOW (ref 78.0–100.0)
Monocytes Absolute: 0.3 K/uL (ref 0.1–1.0)
Monocytes Relative: 5.5 % (ref 3.0–12.0)
Neutro Abs: 3.4 K/uL (ref 1.4–7.7)
Neutrophils Relative %: 57.4 % (ref 43.0–77.0)
Platelets: 424 K/uL — ABNORMAL HIGH (ref 150.0–400.0)
RBC: 4.95 Mil/uL (ref 3.87–5.11)
RDW: 17.6 % — ABNORMAL HIGH (ref 11.5–15.5)
WBC: 5.9 K/uL (ref 4.0–10.5)

## 2024-02-19 LAB — TSH: TSH: 4.7 u[IU]/mL (ref 0.35–5.50)

## 2024-02-19 LAB — HEMOGLOBIN A1C: Hgb A1c MFr Bld: 6.2 % (ref 4.6–6.5)

## 2024-02-19 MED ORDER — COVID-19 MRNA VACC (MODERNA) 50 MCG/0.5ML IM SUSY: 0.5000 mL | PREFILLED_SYRINGE | Freq: Once | INTRAMUSCULAR | 0 refills | Status: AC

## 2024-02-19 NOTE — Assessment & Plan Note (Signed)
 Chronic Saw endocrine-stopped her multivitamin with calcium  and is now taking a multivitamin without calcium  Will check CMP, PTH DEXA due next month - ordered Per endocrine the plan was to monitor since bone density was good and urine 24 hr calcium  was normal

## 2024-02-19 NOTE — Assessment & Plan Note (Signed)
 Chronic Regular exercise and healthy diet encouraged Check lipid panel, CMP Continue Crestor 10 mg daily

## 2024-02-19 NOTE — Assessment & Plan Note (Signed)
 Chronic DEXA due next month - ordered She is very active and does a lot of walking at work Continue vitamin D  daily, multivitamin

## 2024-02-19 NOTE — Assessment & Plan Note (Addendum)
 Chronic Goal BP < 130/80 Blood pressure controlled when rechecked and has been well-controlled at home Continue valsartan  160 mg daily and amlodipine  10 mg daily CMP, cbc Monitor BP at home

## 2024-02-19 NOTE — Assessment & Plan Note (Signed)
 Chronic  Lab Results  Component Value Date   HGBA1C 6.8 (H) 08/16/2023   Sugars controlled Check A1c Continue metformin  XR 1000 mg daily with breakfast Stressed regular exercise, diabetic diet

## 2024-02-19 NOTE — Assessment & Plan Note (Signed)
 New Having right groin pain that she feels primarily when she gets up after sitting for long periods of time-does not last-no pain when walking, sitting up or laying ?  If the leg versus tendinitis, hip flexor tightness Referral to sports medicine

## 2024-02-21 LAB — PTH, INTACT AND CALCIUM
Calcium: 10.9 mg/dL — ABNORMAL HIGH (ref 8.6–10.4)
PTH: 87 pg/mL — ABNORMAL HIGH (ref 16–77)

## 2024-02-24 ENCOUNTER — Ambulatory Visit: Payer: Self-pay | Admitting: Internal Medicine

## 2024-02-24 DIAGNOSIS — D649 Anemia, unspecified: Secondary | ICD-10-CM

## 2024-02-27 ENCOUNTER — Other Ambulatory Visit: Payer: Self-pay | Admitting: Internal Medicine

## 2024-02-28 ENCOUNTER — Telehealth: Payer: Self-pay | Admitting: Radiology

## 2024-02-28 ENCOUNTER — Other Ambulatory Visit: Payer: Self-pay | Admitting: Internal Medicine

## 2024-02-28 MED ORDER — VALSARTAN 160 MG PO TABS: 160.0000 mg | ORAL_TABLET | Freq: Every day | ORAL | 1 refills | Status: AC

## 2024-02-28 NOTE — Telephone Encounter (Signed)
 Copied from CRM [id number]. Topic: Clinical - Lab/Test Results >> Feb 28, 2024 11:06 AM Robinson DEL wrote: Reason for CRM: Patient has questions about her labs done on 9/09.  Marcia Garcia [phone]

## 2024-02-28 NOTE — Telephone Encounter (Signed)
 Spoke with patient today and labs given

## 2024-02-28 NOTE — Telephone Encounter (Signed)
 Copied from CRM [id number]. Topic: Clinical - Medication Refill >> Feb 28, 2024 11:00 AM Robinson H wrote: Medication: valsartan  (DIOVAN ) 160 MG tablet  Has the patient contacted their pharmacy? Yes,waiting on provider  (Agent: If no, request that the patient contact the pharmacy for the refill. If patient does not wish to contact the pharmacy document the reason why and proceed with request.) (Agent: If yes, when and what did the pharmacy advise?)  This is the patient's preferred pharmacy:  [id number] - [place], [place] - [address] [place] [address] Phone: [phone] Fax: [phone]  Is this the correct pharmacy for this prescription? Yes If no, delete pharmacy and type the correct one.   Has the prescription been filled recently? No  Is the patient out of the medication? No  Has the patient been seen for an appointment in the last year OR does the patient have an upcoming appointment? Yes  Can we respond through MyChart? Yes  Agent: Please be advised that Rx refills may take up to 3 business days. We ask that you follow-up with your pharmacy.

## 2024-03-06 ENCOUNTER — Other Ambulatory Visit: Payer: Self-pay | Admitting: Internal Medicine

## 2024-03-11 ENCOUNTER — Other Ambulatory Visit: Payer: Self-pay | Admitting: Internal Medicine

## 2024-03-13 ENCOUNTER — Ambulatory Visit (INDEPENDENT_AMBULATORY_CARE_PROVIDER_SITE_OTHER): Admitting: Family Medicine

## 2024-03-13 ENCOUNTER — Ambulatory Visit (INDEPENDENT_AMBULATORY_CARE_PROVIDER_SITE_OTHER)

## 2024-03-13 ENCOUNTER — Other Ambulatory Visit (INDEPENDENT_AMBULATORY_CARE_PROVIDER_SITE_OTHER)

## 2024-03-13 VITALS — BP 142/82 | HR 76 | Ht 60.0 in | Wt 144.0 lb

## 2024-03-13 DIAGNOSIS — G8929 Other chronic pain: Secondary | ICD-10-CM

## 2024-03-13 DIAGNOSIS — M25551 Pain in right hip: Secondary | ICD-10-CM

## 2024-03-13 DIAGNOSIS — D649 Anemia, unspecified: Secondary | ICD-10-CM

## 2024-03-13 DIAGNOSIS — M160 Bilateral primary osteoarthritis of hip: Secondary | ICD-10-CM | POA: Diagnosis not present

## 2024-03-13 NOTE — Progress Notes (Signed)
   Marcia Ileana Collet, PhD, LAT, ATC acting as a scribe for Artist Lloyd, MD.  Marcia Garcia is a 68 y.o. female who presents to [facility] today for R leg pain ongoing for quite away. Pain will flare up intermittently. Pt locates pain to the anterior aspect of the R hip, proximal anterior thigh, and slightly into the groin.   Low back pain: no Radiates: no Aggravates: transitioning to stand Treatments tried: Tylenol,   Pertinent review of systems: No fevers or chills  Relevant historical information: Hypertension and diabetes   Exam:  BP (!) 142/82   Pulse 76   Ht 5' (1.524 m)   Wt 144 lb (65.3 kg)   SpO2 99%   BMI 28.12 kg/m  General: Well Developed, well nourished, and in no acute distress.   MSK: Right hip normal-appearing range of motion limited flexion and rotation.  Strength is intact without significant pain. Leg lengths are equal   Lab and Radiology Results  X-ray images right hip obtained today personally and independently interpreted. Mild DJD the right hip.  No acute fractures. Await formal radiology review    Assessment and Plan: 68 y.o. female with right anterior thigh pain due to hip DJD based on my x-ray interpretation.  Symptoms are not bad enough at this time to proceed with injection or surgery.  Plan for watchful waiting using Tylenol as needed.  Consider physical therapy in the future if needed.   PDMP not reviewed this encounter. Orders Placed This Encounter  Procedures   DG HIP UNILAT W OR W/O PELVIS 2-3 VIEWS RIGHT    Standing Status:   Future    Number of Occurrences:   1    Expiration Date:   04/13/2024    Reason for Exam (SYMPTOM  OR DIAGNOSIS REQUIRED):   right hip pain    Preferred imaging location?:   [facility]   No orders of the defined types were placed in this encounter.    Discussed warning signs or symptoms. Please see discharge instructions. Patient expresses understanding.   The  above documentation has been reviewed and is accurate and complete Artist Lloyd, M.D.

## 2024-03-13 NOTE — Patient Instructions (Addendum)
 Thank you for coming in today.   Please get an Xray today before you leave

## 2024-03-15 LAB — PROTEIN ELECTROPHORESIS, SERUM
Albumin ELP: 4.7 g/dL (ref 3.8–4.8)
Alpha 1: 0.3 g/dL (ref 0.2–0.3)
Alpha 2: 0.7 g/dL (ref 0.5–0.9)
Beta 2: 0.5 g/dL (ref 0.2–0.5)
Beta Globulin: 0.5 g/dL (ref 0.4–0.6)
Gamma Globulin: 1.7 g/dL (ref 0.8–1.7)
Total Protein: 8.4 g/dL — ABNORMAL HIGH (ref 6.1–8.1)

## 2024-03-18 ENCOUNTER — Other Ambulatory Visit

## 2024-03-18 LAB — PROTEIN ELECTROPHORESIS, URINE REFLEX
Albumin ELP, Urine: 25.7 %
Alpha-1-Globulin, U: 6.8 %
Alpha-2-Globulin, U: 15.4 %
Beta Globulin, U: 26.7 %
Gamma Globulin, U: 25.4 %
Protein, Ur: 17.1 mg/dL

## 2024-03-19 ENCOUNTER — Ambulatory Visit: Payer: Self-pay | Admitting: Internal Medicine

## 2024-03-19 ENCOUNTER — Ambulatory Visit: Payer: Self-pay | Admitting: Family Medicine

## 2024-03-19 NOTE — Progress Notes (Signed)
Right hip x-ray shows mild arthritis.

## 2024-03-20 ENCOUNTER — Telehealth: Payer: Self-pay | Admitting: Family Medicine

## 2024-03-20 NOTE — Telephone Encounter (Signed)
 Noted, thanks!

## 2024-03-20 NOTE — Telephone Encounter (Signed)
 Patient called and asked if Dr. Joane could let her know what her xrays show.  Please advise.

## 2024-03-20 NOTE — Telephone Encounter (Signed)
 Patient called back and stated that she did not need you to call her anymore, she found the results in the mychart and does not need a paper copy. FYI

## 2024-03-31 ENCOUNTER — Other Ambulatory Visit: Payer: Self-pay | Admitting: Internal Medicine

## 2024-03-31 DIAGNOSIS — M85851 Other specified disorders of bone density and structure, right thigh: Secondary | ICD-10-CM

## 2024-03-31 DIAGNOSIS — E2839 Other primary ovarian failure: Secondary | ICD-10-CM

## 2024-04-01 ENCOUNTER — Ambulatory Visit: Admitting: Family Medicine

## 2024-04-01 ENCOUNTER — Ambulatory Visit (INDEPENDENT_AMBULATORY_CARE_PROVIDER_SITE_OTHER)
Admission: RE | Admit: 2024-04-01 | Discharge: 2024-04-01 | Disposition: A | Discharge status: Home / Self Care | Source: Ambulatory Visit | Attending: Internal Medicine | Admitting: Internal Medicine

## 2024-04-01 ENCOUNTER — Other Ambulatory Visit (HOSPITAL_BASED_OUTPATIENT_CLINIC_OR_DEPARTMENT_OTHER): Admitting: Radiology

## 2024-04-01 DIAGNOSIS — M85851 Other specified disorders of bone density and structure, right thigh: Secondary | ICD-10-CM | POA: Diagnosis not present

## 2024-04-01 DIAGNOSIS — E2839 Other primary ovarian failure: Secondary | ICD-10-CM

## 2024-04-01 DIAGNOSIS — M85852 Other specified disorders of bone density and structure, left thigh: Secondary | ICD-10-CM | POA: Diagnosis not present

## 2024-04-03 ENCOUNTER — Ambulatory Visit: Payer: Self-pay | Admitting: Internal Medicine

## 2024-04-03 DIAGNOSIS — M85851 Other specified disorders of bone density and structure, right thigh: Secondary | ICD-10-CM

## 2024-04-10 ENCOUNTER — Ambulatory Visit: Admitting: Family Medicine

## 2024-04-16 ENCOUNTER — Ambulatory Visit: Admitting: Internal Medicine

## 2024-04-22 DIAGNOSIS — Z01419 Encounter for gynecological examination (general) (routine) without abnormal findings: Secondary | ICD-10-CM | POA: Diagnosis not present

## 2024-05-06 DIAGNOSIS — H40053 Ocular hypertension, bilateral: Secondary | ICD-10-CM | POA: Diagnosis not present

## 2024-05-06 DIAGNOSIS — E119 Type 2 diabetes mellitus without complications: Secondary | ICD-10-CM | POA: Diagnosis not present

## 2024-05-06 DIAGNOSIS — H524 Presbyopia: Secondary | ICD-10-CM | POA: Diagnosis not present

## 2024-05-06 DIAGNOSIS — H2512 Age-related nuclear cataract, left eye: Secondary | ICD-10-CM | POA: Diagnosis not present

## 2024-05-06 DIAGNOSIS — H26491 Other secondary cataract, right eye: Secondary | ICD-10-CM | POA: Diagnosis not present

## 2024-05-06 DIAGNOSIS — H35371 Puckering of macula, right eye: Secondary | ICD-10-CM | POA: Diagnosis not present

## 2024-05-06 LAB — OPHTHALMOLOGY REPORT-SCANNED

## 2024-05-20 ENCOUNTER — Ambulatory Visit: Payer: PPO

## 2024-05-20 VITALS — Ht 60.0 in | Wt 144.0 lb

## 2024-05-20 DIAGNOSIS — Z0000 Encounter for general adult medical examination without abnormal findings: Secondary | ICD-10-CM

## 2024-05-20 NOTE — Progress Notes (Signed)
 Chief Complaint  Patient presents with   Medicare Wellness     Subjective:   Marcia Garcia is a 68 y.o. female who presents for a Medicare Annual Wellness Visit.  Visit info / Clinical Intake: Medicare Wellness Visit Type:: Subsequent Annual Wellness Visit Persons participating in visit and providing information:: patient Medicare Wellness Visit Mode:: Telephone If telephone:: video declined Since this visit was completed virtually, some vitals may be partially provided or unavailable. Missing vitals are due to the limitations of the virtual format.: Documented vitals are patient reported If Telephone or Video please confirm:: I connected with patient using audio/video enable telemedicine. I verified patient identity with two identifiers, discussed telehealth limitations, and patient agreed to proceed. Patient Location:: Home Provider Location:: Office Interpreter Needed?: No Pre-visit prep was completed: yes AWV questionnaire completed by patient prior to visit?: no Living arrangements:: (!) lives alone Patient's Overall Health Status Rating: good Typical amount of pain: none Does pain affect daily life?: no Are you currently prescribed opioids?: no  Dietary Habits and Nutritional Risks How many meals a day?: 2 Eats fruit and vegetables daily?: yes Most meals are obtained by: preparing own meals In the last 2 weeks, have you had any of the following?: none Diabetic:: (!) yes Any non-healing wounds?: no How often do you check your BS?: 0 Would you like to be referred to a Nutritionist or for Diabetic Management? : no  Functional Status Activities of Daily Living (to include ambulation/medication): Independent Ambulation: Independent with device- listed below Home Assistive Devices/Equipment: Eyeglasses Medication Administration: Independent Home Management (perform basic housework or laundry): Independent Manage your own finances?: yes Primary transportation is:  driving Concerns about vision?: no *vision screening is required for WTM* Concerns about hearing?: no  Fall Screening Falls in the past year?: 0 Number of falls in past year: 0 Was there an injury with Fall?: 0 Fall Risk Category Calculator: 0 Patient Fall Risk Level: Low Fall Risk  Fall Risk Patient at Risk for Falls Due to: No Fall Risks Fall risk Follow up: Falls evaluation completed; Falls prevention discussed  Home and Transportation Safety: All rugs have non-skid backing?: N/A, no rugs All stairs or steps have railings?: N/A, no stairs Grab bars in the bathtub or shower?: yes Have non-skid surface in bathtub or shower?: yes Good home lighting?: yes Regular seat belt use?: yes Hospital stays in the last year:: no  Cognitive Assessment Difficulty concentrating, remembering, or making decisions? : no Will 6CIT or Mini Cog be Completed: yes What year is it?: 0 points What month is it?: 0 points Give patient an address phrase to remember (5 components): [address] [place], [place] About what time is it?: 0 points Count backwards from 20 to 1: 0 points Say the months of the year in reverse: 0 points Repeat the address phrase from earlier: 0 points 6 CIT Score: 0 points  Advance Directives (For Healthcare) Does Patient Have a Medical Advance Directive?: Yes Type of Advance Directive: Healthcare Power of Marcia Garcia; Living will Copy of Healthcare Power of Attorney in Chart?: No - copy requested Copy of Living Will in Chart?: No - copy requested  Reviewed/Updated  Reviewed/Updated: Reviewed All (Medical, Surgical, Family, Medications, Allergies, Care Teams, Patient Goals)    Allergies (verified) Atorvastatin and Lisinopril    Current Medications (verified) Outpatient Encounter Medications as of 05/20/2024  Medication Sig   Acetaminophen (TYLENOL PO)    amLODipine  (NORVASC ) 10 MG tablet TAKE 1 TABLET BY MOUTH EVERY DAY   aspirin  EC 81 MG tablet Take 1 tablet by mouth  daily.   blood glucose meter kit and supplies KIT One touch glucometer.  Use daily and as needed to check sugars as directed.  (FOR E11.9).   Cholecalciferol (VITAMIN D ) 50 MCG (2000 UT) CAPS Take by mouth.   docusate sodium (COLACE) 50 MG capsule Take 50 mg by mouth 2 (two) times daily.   metFORMIN  (GLUCOPHAGE -XR) 500 MG 24 hr tablet TAKE 2 TABLETS BY MOUTH EVERY DAY WITH BREAKFAST   rosuvastatin  (CRESTOR ) 10 MG tablet Take 1 tablet (10 mg total) by mouth daily.   valsartan  (DIOVAN ) 160 MG tablet Take 1 tablet (160 mg total) by mouth daily.   No facility-administered encounter medications on file as of 05/20/2024.    History: Past Medical History:  Diagnosis Date   Anemia    Diabetes mellitus without complication (HCC)    Hyperlipidemia    Hypertension    Past Surgical History:  Procedure Laterality Date   PARTIAL HYSTERECTOMY     Family History  Problem Relation Age of Onset   Diabetes Mother    Diabetes Maternal Grandmother    Alzheimer's disease Maternal Grandmother    Colon cancer Neg Hx    Esophageal cancer Neg Hx    Rectal cancer Neg Hx    Stomach cancer Neg Hx    Hypercalcemia Neg Hx    Social History   Occupational History   Occupation: Prep for [employer]  Tobacco Use   Smoking status: Never   Smokeless tobacco: Never  Vaping Use   Vaping status: Never Used  Substance and Sexual Activity   Alcohol use: Never   Drug use: Not Currently   Sexual activity: Not Currently   Tobacco Counseling Counseling given: Not Answered  SDOH Screenings   Food Insecurity: No Food Insecurity (05/20/2024)  Housing: Unknown (05/20/2024)  Transportation Needs: No Transportation Needs (05/20/2024)  Utilities: Not At Risk (05/20/2024)  Alcohol Screen: Low Risk  (05/18/2023)  Depression (PHQ2-9): Low Risk  (05/20/2024)  Financial Resource Strain: Low Risk  (05/18/2023)  Physical Activity: Insufficiently Active (05/20/2024)  Social Connections: Moderately Isolated (05/20/2024)   Stress: No Stress Concern Present (05/20/2024)  Tobacco Use: Low Risk  (05/20/2024)  Health Literacy: Adequate Health Literacy (05/20/2024)   See flowsheets for full screening details  Depression Screen PHQ 2 & 9 Depression Scale- Over the past 2 weeks, how often have you been bothered by any of the following problems? Little interest or pleasure in doing things: 0 Feeling down, depressed, or hopeless (PHQ Adolescent also includes...irritable): 0 PHQ-2 Total Score: 0 Trouble falling or staying asleep, or sleeping too much: 0 Feeling tired or having little energy: 0 Poor appetite or overeating (PHQ Adolescent also includes...weight loss): 0 Feeling bad about yourself - or that you are a failure or have let yourself or your family down: 0 Trouble concentrating on things, such as reading the newspaper or watching television (PHQ Adolescent also includes...like school work): 0 Moving or speaking so slowly that other people could have noticed. Or the opposite - being so fidgety or restless that you have been moving around a lot more than usual: 0 Thoughts that you would be better off dead, or of hurting yourself in some way: 0 PHQ-9 Total Score: 0 If you checked off any problems, how difficult have these problems made it for you to do your work, take care of things at home, or get along with other people?: Not difficult at all  Depression Treatment Depression Interventions/Treatment :  PHQ2-9 Score <4 Follow-up Not Indicated     Goals Addressed               This Visit's Progress     Patient Stated (pt-stated)        Patient stated she plans to continue manage her diet and staying active             Objective:    Today's Vitals   05/20/24 1013  Weight: 144 lb (65.3 kg)  Height: 5' (1.524 m)   Body mass index is 28.12 kg/m.  Hearing/Vision screen Hearing Screening - Comments:: Denies hearing difficulties   Vision Screening - Comments:: Wears rx glasses - up to date with  routine eye exams with Dr Adine Haddock Immunizations and Health Maintenance Health Maintenance  Topic Date Due   DTaP/Tdap/Td (2 - Td or Tdap) 07/29/2022   Diabetic kidney evaluation - Urine ACR  08/15/2024   HEMOGLOBIN A1C  08/18/2024   COVID-19 Vaccine (7 - 2025-26 season) 08/29/2024   Diabetic kidney evaluation - eGFR measurement  02/18/2025   FOOT EXAM  02/18/2025   OPHTHALMOLOGY EXAM  05/06/2025   Medicare Annual Wellness (AWV)  05/20/2025   Mammogram  05/21/2025   Bone Density Scan  04/01/2026   Colonoscopy  04/15/2028   Pneumococcal Vaccine: 50+ Years  Completed   Influenza Vaccine  Completed   Meningococcal B Vaccine  Aged Out   Hepatitis C Screening  Discontinued   Zoster Vaccines- Shingrix  Discontinued        Assessment/Plan:  This is a routine wellness examination for Marcia Garcia.  Patient Care Team: Geofm Glade PARAS, MD as PCP - General (Internal Medicine) Haddock Adine, MD as Consulting Physician (Ophthalmology)  I have personally reviewed and noted the following in the patient's chart:   Medical and social history Use of alcohol, tobacco or illicit drugs  Current medications and supplements including opioid prescriptions. Functional ability and status Nutritional status Physical activity Advanced directives List of other physicians Hospitalizations, surgeries, and ER visits in previous 12 months Vitals Screenings to include cognitive, depression, and falls Referrals and appointments  No orders of the defined types were placed in this encounter.  In addition, I have reviewed and discussed with patient certain preventive protocols, quality metrics, and best practice recommendations. A written personalized care plan for preventive services as well as general preventive health recommendations were provided to patient.   Verdie CHRISTELLA Saba, CMA   05/20/2024   Return in 1 year (on 05/20/2025).  After Visit Summary: (MyChart) Due to this being a telephonic visit,  the after visit summary with patients personalized plan was offered to patient via MyChart   Nurse Notes: scheduled 2026 AWV/CPE appts

## 2024-05-20 NOTE — Patient Instructions (Addendum)
 Marcia Garcia,  Thank you for taking the time for your Medicare Wellness Visit. I appreciate your continued commitment to your health goals. Please review the care plan we discussed, and feel free to reach out if I can assist you further.  Please note that Annual Wellness Visits do not include a physical exam. Some assessments may be limited, especially if the visit was conducted virtually. If needed, we may recommend an in-person follow-up with your provider.  Ongoing Care Seeing your primary care provider every 3 to 6 months helps us  monitor your health and provide consistent, personalized care.   Referrals If a referral was made during today's visit and you haven't received any updates within two weeks, please contact the referred provider directly to check on the status.  Recommended Screenings:  Health Maintenance  Topic Date Due   DTaP/Tdap/Td vaccine (2 - Td or Tdap) 07/29/2022   Yearly kidney health urinalysis for diabetes  08/15/2024   Hemoglobin A1C  08/18/2024   COVID-19 Vaccine (7 - 2025-26 season) 08/29/2024   Yearly kidney function blood test for diabetes  02/18/2025   Complete foot exam   02/18/2025   Eye exam for diabetics  05/06/2025   Medicare Annual Wellness Visit  05/20/2025   Breast Cancer Screening  05/21/2025   Osteoporosis screening with Bone Density Scan  04/01/2026   Colon Cancer Screening  04/15/2028   Pneumococcal Vaccine for age over 24  Completed   Flu Shot  Completed   Meningitis B Vaccine  Aged Out   Hepatitis C Screening  Discontinued   Zoster (Shingles) Vaccine  Discontinued       05/20/2024   10:15 AM  Advanced Directives  Does Patient Have a Medical Advance Directive? Yes  Type of Estate Agent of [place];Living will  Copy of Healthcare Power of Attorney in Chart? No - copy requested    Vision: Annual vision screenings are recommended for early detection of glaucoma, cataracts, and diabetic retinopathy. These exams can  also reveal signs of chronic conditions such as diabetes and high blood pressure.  Dental: Annual dental screenings help detect early signs of oral cancer, gum disease, and other conditions linked to overall health, including heart disease and diabetes.

## 2024-05-22 ENCOUNTER — Encounter: Admitting: Internal Medicine

## 2024-05-29 LAB — HM MAMMOGRAPHY

## 2024-06-02 ENCOUNTER — Encounter: Payer: Self-pay | Admitting: Internal Medicine

## 2024-06-10 ENCOUNTER — Ambulatory Visit: Admitting: Internal Medicine

## 2024-06-10 ENCOUNTER — Other Ambulatory Visit

## 2024-06-10 ENCOUNTER — Encounter: Payer: Self-pay | Admitting: Internal Medicine

## 2024-06-10 VITALS — BP 132/70 | HR 93 | Ht 60.0 in | Wt 144.0 lb

## 2024-06-10 DIAGNOSIS — E210 Primary hyperparathyroidism: Secondary | ICD-10-CM | POA: Diagnosis not present

## 2024-06-10 NOTE — Progress Notes (Unsigned)
 "  Name: Marcia Garcia  MRN/ DOB: [id number], 1955-10-19    Age/ Sex: 68 y.o., female     PCP: Geofm Glade PARAS, MD   Reason for Endocrinology Evaluation: Hyperparathyroidism/ Hypercalcemia      Initial Endocrinology Clinic Visit: 03/21/2021    PATIENT IDENTIFIER: Marcia Garcia is a 68 y.o., female with a past medical history of HTN. She has followed with [facility] since 03/21/2021 for consultative assistance with management of her Hypercalcemia .   HISTORICAL SUMMARY: The patient was first diagnosed with hypercalcemia in 2021 , with a max serum calcium  of 11.4 mg/DL 06/7976 ( 89.15 corrected), with elevated PTH with a max 113 PG/mL 11/2020  DXA - Osteopenia 03/28/2021 and in 03/2024  She was seen by Dr. Kassie 03/2021   SUBJECTIVE:    Today (06/10/2024):  Marcia Garcia is here for a follow-up on hypercalcemia   The pt has NOT been seen at our clinic in 19 months.   She is not on on MVI  She is going to start on [employer] ( no calcium )  She consumes around ~ 2 servings of dietary calcium   She is on stool softeners daily with no constipation  She stays hydrated  No  polydipsia   HISTORY:  Past Medical History:  Past Medical History:  Diagnosis Date   Anemia    Diabetes mellitus without complication (HCC)    Hyperlipidemia    Hypertension    Past Surgical History:  Past Surgical History:  Procedure Laterality Date   PARTIAL HYSTERECTOMY     Social History:  reports that she has never smoked. She has never used smokeless tobacco. She reports that she does not currently use drugs. She reports that she does not drink alcohol. Family History:  Family History  Problem Relation Age of Onset   Diabetes Mother    Diabetes Maternal Grandmother    Alzheimer's disease Maternal Grandmother    Colon cancer Neg Hx    Esophageal cancer Neg Hx    Rectal cancer Neg Hx    Stomach cancer Neg Hx    Hypercalcemia Neg Hx      HOME  MEDICATIONS: Allergies as of 06/10/2024       Reactions   Atorvastatin Hives   Lisinopril     Elevated BP        Medication List        Accurate as of June 10, 2024  9:16 AM. If you have any questions, ask your nurse or doctor.          amLODipine  10 MG tablet Commonly known as: NORVASC  TAKE 1 TABLET BY MOUTH EVERY DAY   aspirin EC 81 MG tablet Take 1 tablet by mouth daily.   blood glucose meter kit and supplies Kit One touch glucometer.  Use daily and as needed to check sugars as directed.  (FOR E11.9).   docusate sodium 50 MG capsule Commonly known as: COLACE Take 50 mg by mouth 2 (two) times daily.   metFORMIN  500 MG 24 hr tablet Commonly known as: GLUCOPHAGE -XR TAKE 2 TABLETS BY MOUTH EVERY DAY WITH BREAKFAST   rosuvastatin  10 MG tablet Commonly known as: CRESTOR  Take 1 tablet (10 mg total) by mouth daily.   TYLENOL PO   valsartan  160 MG tablet Commonly known as: DIOVAN  Take 1 tablet (160 mg total) by mouth daily.   Vitamin D  50 MCG (2000 UT) Caps Take by mouth.          OBJECTIVE:  PHYSICAL EXAM: VS: BP 132/70   Pulse 93   Ht 5' (1.524 m)   Wt 144 lb (65.3 kg)   SpO2 99%   BMI 28.12 kg/m    EXAM: General: Pt appears well and is in NAD  Neck: General: Supple without adenopathy. Thyroid : No goiter or nodules appreciated.   Lungs: Clear with good BS bilat   Heart: Auscultation: RRR.  Abdomen: soft, nontender  Extremities:  BL LE: No pretibial edema   Mental Status: Judgment, insight: Intact Orientation: Oriented to time, place, and person Mood and affect: No depression, anxiety, or agitation     DATA REVIEWED:   Latest Reference Range & Units 06/10/24 09:40  Calcium  8.6 - 10.4 mg/dL 89.4 (H)    Latest Reference Range & Units 06/10/24 09:40  Vitamin D , 25-Hydroxy 30 - 100 ng/mL 41    Latest Reference Range & Units 06/10/24 09:40  PTH, Intact 16 - 77 pg/mL 126 (H)  (H): Data is abnormally high   Latest Reference Range  & Units 02/19/24 10:30  Sodium 135 - 145 mEq/L 137  Potassium 3.5 - 5.1 mEq/L 4.5  Chloride 96 - 112 mEq/L 103  CO2 19 - 32 mEq/L 28  Glucose 70 - 99 mg/dL 888 (H)  BUN 6 - 23 mg/dL 15  Creatinine 9.59 - 8.79 mg/dL 9.23  Calcium  8.6 - 10.4 mg/dL 8.4 - 89.4 mg/dL 89.0 (H) 88.7 (H)  Alkaline Phosphatase 39 - 117 U/L 59  Albumin 3.5 - 5.2 g/dL 4.8  AST 0 - 37 U/L 26  ALT 0 - 35 U/L 23  Total Protein 6.0 - 8.3 g/dL 8.7 (H)  Total Bilirubin 0.2 - 1.2 mg/dL 0.8  GFR >39.99 mL/min 80.91    Latest Reference Range & Units 02/19/24 10:30  PTH, Intact 16 - 77 pg/mL 87 (H)  TSH 0.35 - 5.50 uIU/mL 4.70      DXA 04/01/2024   (osteoporotic) fracture. On calcium  and vitamin D .   Results:   Lumbar spine L1-L4 Femoral neck (FN) 33% distal radius Ultra distal radius  T-score -0.8 RFN: -1.4 LFN: -1.5 -0.8 -0.4  Change in BMD from previous DXA test (%) -2.4% +2.8% -5.2% N/A  (*) statistically significant     Old records , labs and images have been reviewed.   ASSESSMENT / PLAN / RECOMMENDATIONS:   Primary Hyperparathyroidism:  - We again discussed the pathophysiology of primary hyperparathyroidism and the importance of monitoring for endorgan damage -Patient is asymptomatic -She is not on multivitamin, but she did find calcium  for multivitamin which is okay to start -She has discontinued vitamin D , will check today -Her most recent BMP through PCPs office showed elevated serum calcium  -Will proceed with 24-hour urinary calcium  excretion -Up-to-date on DXA scan-osteopenia - Serum calcium  is trending down, but remains elevated with elevated PTH - Vitamin D  is normal, there is no need to start OTC vitamin D  at this time, we will continue to monitor  Recommendations  Encourage hydration Patient to avoid OTC calcium  tablets Patient to maintain 2-3 servings of low-fat dietary calcium    Follow-up in 6 months   Signed electronically by: Stefano Redgie Butts, MD  [facility]  Endocrinology  [facility] [address] [place] [place], [place] [address] Phone: [phone] FAX: [phone]      CC: Geofm Glade PARAS, MD [address] [place] [address] Phone: [phone]  Fax: [phone]   Return to Endocrinology clinic as below: Future Appointments  Date Time Provider Department Center  08/19/2024  9:10 AM Geofm, Glade PARAS, MD [facility]  05/22/2025  8:50 AM [facility]ANNUAL WELLNESS VISIT 2 [facility]  05/22/2025  9:30 AM Geofm, Glade PARAS, MD [facility]     "

## 2024-06-10 NOTE — Patient Instructions (Signed)
 24-Hour Urine Collection   You will be collecting your urine for a 24-hour period of time.  Your timer starts with your first urine of the morning (For example - If you first pee at 9AM, your timer will start at 9AM)  Throw away your first urine of the morning  Collect your urine every time you pee for the next 24 hours STOP your urine collection 24 hours after you started the collection (For example - You would stop at 9AM the day after you started)

## 2024-06-11 ENCOUNTER — Ambulatory Visit: Payer: Self-pay | Admitting: Internal Medicine

## 2024-06-11 ENCOUNTER — Encounter: Payer: Self-pay | Admitting: Pharmacist

## 2024-06-11 LAB — VITAMIN D 25 HYDROXY (VIT D DEFICIENCY, FRACTURES): Vit D, 25-Hydroxy: 41 ng/mL (ref 30–100)

## 2024-06-11 LAB — PTH, INTACT AND CALCIUM
Calcium: 10.5 mg/dL — ABNORMAL HIGH (ref 8.6–10.4)
PTH: 126 pg/mL — ABNORMAL HIGH (ref 16–77)

## 2024-06-11 NOTE — Progress Notes (Signed)
 Pharmacy Quality Measure Review  This patient is appearing on a report for being at risk of failing the Controlling Blood Pressure measure this calendar year.   Last documented BP 132/70 on 06/10/24  No further action needed.  Darrelyn Drum, PharmD, BCPS, CPP Clinical Pharmacist Practitioner [facility] [phone]

## 2024-06-16 ENCOUNTER — Other Ambulatory Visit: Payer: Self-pay | Admitting: Internal Medicine

## 2024-06-30 ENCOUNTER — Other Ambulatory Visit

## 2024-07-01 LAB — CALCIUM, 24-HOUR URINE WITH CREATININE
CALCIUM/CREATININE RATIO: 197 mg/g{creat} (ref 30–275)
Calcium, 24H Urine: 208 mg/(24.h)
Creatinine, 24H Ur: 1.02 g/(24.h) (ref 0.50–2.15)

## 2024-08-18 ENCOUNTER — Ambulatory Visit: Admitting: Internal Medicine

## 2024-08-19 ENCOUNTER — Ambulatory Visit: Admitting: Internal Medicine

## 2024-12-15 ENCOUNTER — Ambulatory Visit: Admitting: Internal Medicine

## 2025-05-22 ENCOUNTER — Ambulatory Visit

## 2025-05-22 ENCOUNTER — Encounter: Admitting: Internal Medicine
